# Patient Record
Sex: Female | Born: 1955 | Race: White | Hispanic: No | Marital: Single | Smoking: Never smoker
Health system: Southern US, Community
[De-identification: ages and names within clinical notes are randomized; demographics above are authoritative.]

## PROBLEM LIST (undated history)

## (undated) DIAGNOSIS — I878 Other specified disorders of veins: Secondary | ICD-10-CM

## (undated) DIAGNOSIS — E039 Hypothyroidism, unspecified: Secondary | ICD-10-CM

## (undated) DIAGNOSIS — J069 Acute upper respiratory infection, unspecified: Secondary | ICD-10-CM

## (undated) DIAGNOSIS — I10 Essential (primary) hypertension: Secondary | ICD-10-CM

## (undated) DIAGNOSIS — H539 Unspecified visual disturbance: Secondary | ICD-10-CM

## (undated) DIAGNOSIS — C55 Malignant neoplasm of uterus, part unspecified: Secondary | ICD-10-CM

## (undated) DIAGNOSIS — M26629 Arthralgia of temporomandibular joint, unspecified side: Secondary | ICD-10-CM

## (undated) DIAGNOSIS — M79673 Pain in unspecified foot: Secondary | ICD-10-CM

## (undated) DIAGNOSIS — K5790 Diverticulosis of intestine, part unspecified, without perforation or abscess without bleeding: Secondary | ICD-10-CM

## (undated) DIAGNOSIS — G4730 Sleep apnea, unspecified: Secondary | ICD-10-CM

## (undated) DIAGNOSIS — N390 Urinary tract infection, site not specified: Secondary | ICD-10-CM

## (undated) DIAGNOSIS — L255 Unspecified contact dermatitis due to plants, except food: Secondary | ICD-10-CM

## (undated) DIAGNOSIS — M797 Fibromyalgia: Secondary | ICD-10-CM

## (undated) DIAGNOSIS — R0989 Other specified symptoms and signs involving the circulatory and respiratory systems: Secondary | ICD-10-CM

## (undated) DIAGNOSIS — E785 Hyperlipidemia, unspecified: Secondary | ICD-10-CM

## (undated) HISTORY — DX: Arthralgia of temporomandibular joint, unspecified side: M26.629

## (undated) HISTORY — DX: Malignant neoplasm of uterus, part unspecified: C55

## (undated) HISTORY — PX: ABDOMINAL HYSTERECTOMY: SUR658

## (undated) HISTORY — DX: Unspecified contact dermatitis due to plants, except food: L25.5

## (undated) HISTORY — DX: Morbid (severe) obesity due to excess calories: E66.01

## (undated) HISTORY — DX: Pain in unspecified foot: M79.673

## (undated) HISTORY — DX: Other specified symptoms and signs involving the circulatory and respiratory systems: R09.89

## (undated) HISTORY — DX: Hyperlipidemia, unspecified: E78.5

## (undated) HISTORY — DX: Urinary tract infection, site not specified: N39.0

## (undated) HISTORY — DX: Other specified disorders of veins: I87.8

## (undated) HISTORY — DX: Fibromyalgia: M79.7

## (undated) HISTORY — PX: ENDOSCOPIC VEIN LASER TREATMENT: SHX1508

## (undated) HISTORY — DX: Unspecified visual disturbance: H53.9

## (undated) HISTORY — DX: Acute upper respiratory infection, unspecified: J06.9

## (undated) HISTORY — DX: Sleep apnea, unspecified: G47.30

## (undated) HISTORY — PX: DILATION AND CURETTAGE OF UTERUS: SHX78

## (undated) HISTORY — DX: Essential (primary) hypertension: I10

## (undated) HISTORY — DX: Diverticulosis of intestine, part unspecified, without perforation or abscess without bleeding: K57.90

## (undated) HISTORY — PX: FOOT SURGERY: SHX648

## (undated) HISTORY — DX: Hypothyroidism, unspecified: E03.9

---

## 1999-05-19 ENCOUNTER — Ambulatory Visit (HOSPITAL_COMMUNITY): Admission: RE | Admit: 1999-05-19 | Discharge: 1999-05-19 | Payer: Self-pay | Admitting: Obstetrics and Gynecology

## 1999-06-09 ENCOUNTER — Encounter (INDEPENDENT_AMBULATORY_CARE_PROVIDER_SITE_OTHER): Payer: Self-pay

## 1999-06-09 ENCOUNTER — Ambulatory Visit (HOSPITAL_COMMUNITY): Admission: RE | Admit: 1999-06-09 | Discharge: 1999-06-09 | Payer: Self-pay

## 2000-02-19 ENCOUNTER — Ambulatory Visit: Admission: RE | Admit: 2000-02-19 | Discharge: 2000-02-19 | Payer: Self-pay | Admitting: *Deleted

## 2002-05-14 ENCOUNTER — Ambulatory Visit (HOSPITAL_BASED_OUTPATIENT_CLINIC_OR_DEPARTMENT_OTHER): Admission: RE | Admit: 2002-05-14 | Discharge: 2002-05-14 | Payer: Self-pay | Admitting: Family Medicine

## 2002-06-30 ENCOUNTER — Ambulatory Visit (HOSPITAL_BASED_OUTPATIENT_CLINIC_OR_DEPARTMENT_OTHER): Admission: RE | Admit: 2002-06-30 | Discharge: 2002-06-30 | Payer: Self-pay | Admitting: Family Medicine

## 2002-11-25 ENCOUNTER — Inpatient Hospital Stay (HOSPITAL_COMMUNITY): Admission: AD | Admit: 2002-11-25 | Discharge: 2002-12-02 | Payer: Self-pay | Admitting: Internal Medicine

## 2002-11-25 ENCOUNTER — Encounter: Payer: Self-pay | Admitting: Family Medicine

## 2003-11-18 ENCOUNTER — Inpatient Hospital Stay (HOSPITAL_COMMUNITY): Admission: EM | Admit: 2003-11-18 | Discharge: 2003-11-25 | Payer: Self-pay | Admitting: *Deleted

## 2004-03-21 ENCOUNTER — Ambulatory Visit: Payer: Self-pay | Admitting: Internal Medicine

## 2004-05-16 ENCOUNTER — Ambulatory Visit: Payer: Self-pay | Admitting: Internal Medicine

## 2004-08-14 ENCOUNTER — Ambulatory Visit: Payer: Self-pay | Admitting: Internal Medicine

## 2004-08-21 ENCOUNTER — Ambulatory Visit (HOSPITAL_COMMUNITY): Admission: RE | Admit: 2004-08-21 | Discharge: 2004-08-21 | Payer: Self-pay | Admitting: Internal Medicine

## 2004-11-14 ENCOUNTER — Ambulatory Visit: Payer: Self-pay | Admitting: Internal Medicine

## 2005-05-15 ENCOUNTER — Ambulatory Visit: Payer: Self-pay | Admitting: Internal Medicine

## 2005-11-13 ENCOUNTER — Ambulatory Visit: Payer: Self-pay | Admitting: Internal Medicine

## 2006-03-12 HISTORY — PX: ABDOMINAL HYSTERECTOMY: SHX81

## 2006-05-15 ENCOUNTER — Ambulatory Visit: Payer: Self-pay | Admitting: Internal Medicine

## 2006-11-12 ENCOUNTER — Ambulatory Visit: Payer: Self-pay | Admitting: Internal Medicine

## 2006-11-15 ENCOUNTER — Ambulatory Visit: Payer: Self-pay | Admitting: Internal Medicine

## 2006-12-18 ENCOUNTER — Encounter (INDEPENDENT_AMBULATORY_CARE_PROVIDER_SITE_OTHER): Payer: Self-pay | Admitting: Obstetrics and Gynecology

## 2006-12-18 ENCOUNTER — Ambulatory Visit (HOSPITAL_COMMUNITY): Admission: RE | Admit: 2006-12-18 | Discharge: 2006-12-18 | Payer: Self-pay | Admitting: Obstetrics and Gynecology

## 2007-02-10 ENCOUNTER — Ambulatory Visit: Payer: Self-pay | Admitting: Internal Medicine

## 2007-08-08 DIAGNOSIS — J42 Unspecified chronic bronchitis: Secondary | ICD-10-CM

## 2007-08-08 DIAGNOSIS — IMO0001 Reserved for inherently not codable concepts without codable children: Secondary | ICD-10-CM

## 2007-08-08 DIAGNOSIS — G4710 Hypersomnia, unspecified: Secondary | ICD-10-CM

## 2007-08-08 DIAGNOSIS — J309 Allergic rhinitis, unspecified: Secondary | ICD-10-CM | POA: Insufficient documentation

## 2007-08-08 DIAGNOSIS — G4733 Obstructive sleep apnea (adult) (pediatric): Secondary | ICD-10-CM

## 2007-08-13 ENCOUNTER — Encounter: Payer: Self-pay | Admitting: Internal Medicine

## 2009-03-25 IMAGING — CT CT ABDOMEN W/O CM
4 of 9 series · 14 of 46 positions shown, 18 images · IV contrast (agent unspecified)
Comparison: none

CLINICAL DATA: Face and neck swelling.  Nausea.  Left submandibular pain.  The patient also has left abdominal wall swelling. 
 MAXILLOFACIAL CT WITHOUT CONTRAST:
TECHNIQUE: Axial and coronal CT imaging was performed through the maxillofacial structures.  No intravenous contrast was administered.
TECHNIQUE: Multidetector CT imaging of the neck was performed following the standard protocol without the administration of intravenous contrast.
TECHNIQUE: Multidetector CT imaging of the abdomen was performed following the standard protocol without IV contrast.

[Series 2: neck_routine 3.0 b40s st · axial · 0.46mm/px · z∈[-258,-216]mm · 2 of 84 slices shown]
[im 7/84  soft-tissue]
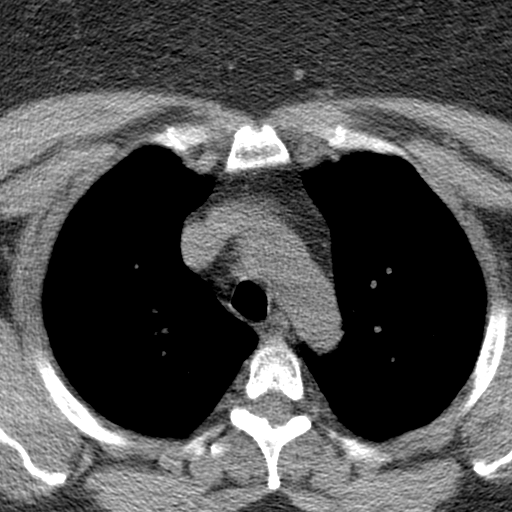
[im 21/84  soft-tissue]
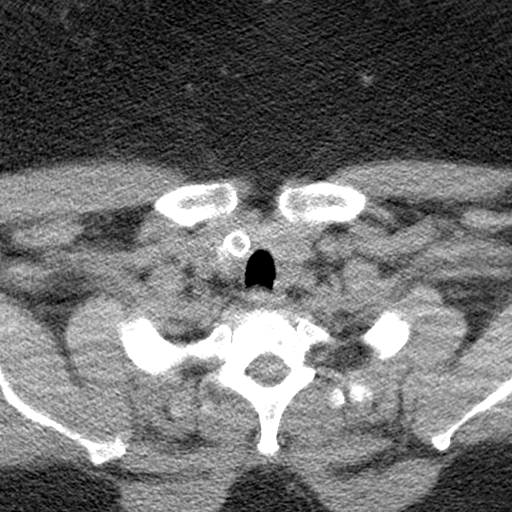

[Series 3: neck_routine 3.0 b70s lung · axial · 0.75mm/px · z∈[-253,-229]mm · 2 of 26 slices shown, 5 images]
[im 9/26  soft-tissue]
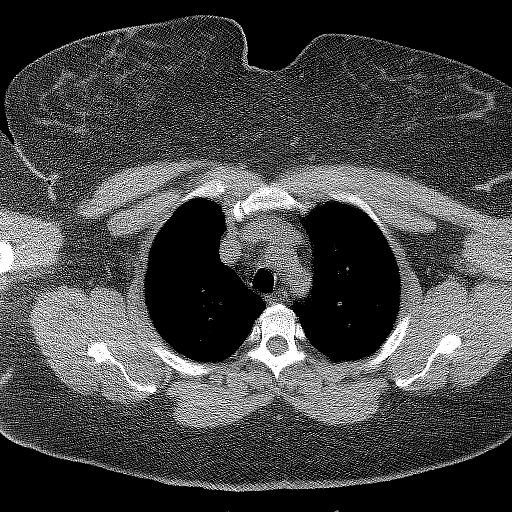
[im 9/26  lung]
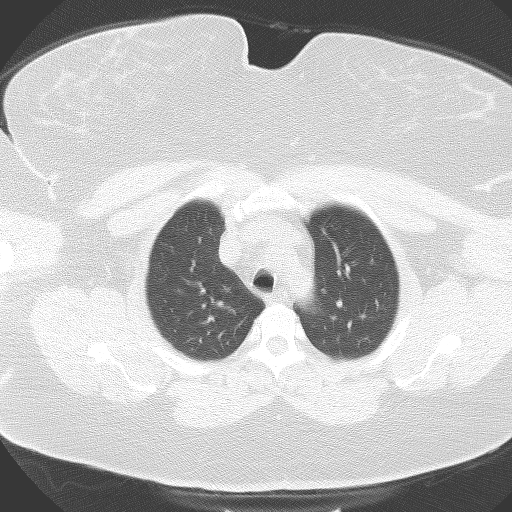
[im 9/26  bone]
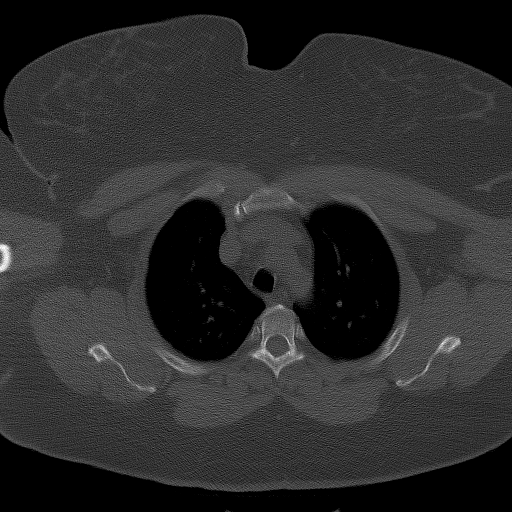
[im 17/26  soft-tissue]
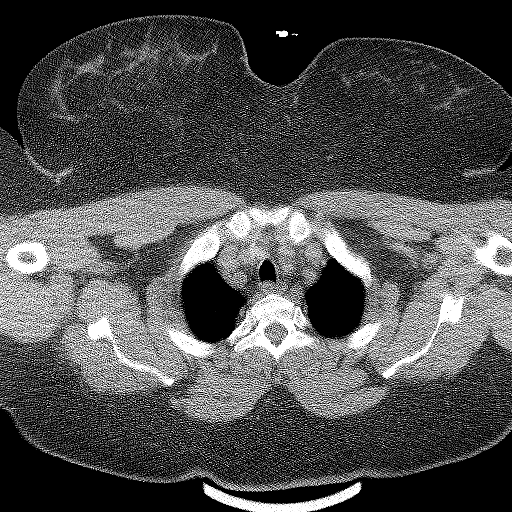
[im 17/26  lung]
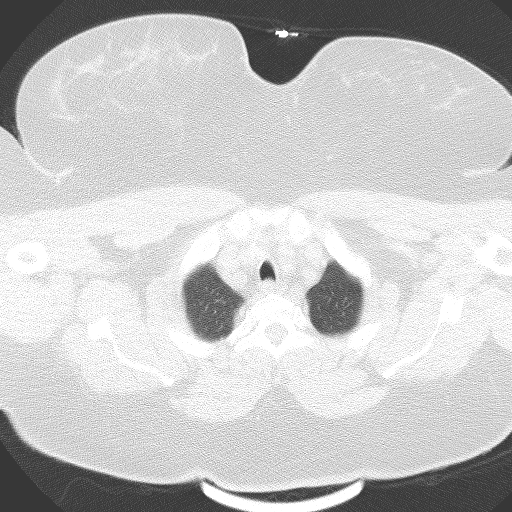

[Series 6: abd_xxl 5.0 b10f st · axial · 0.98mm/px · z∈[+984,+1214]mm · 7 of 62 slices shown]
[im 8/62  soft-tissue]
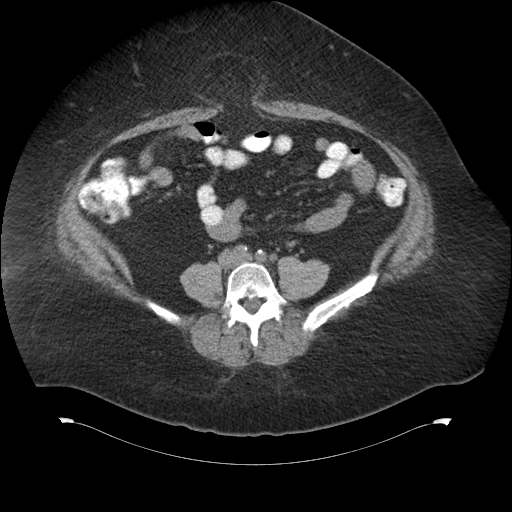
[im 16/62  soft-tissue]
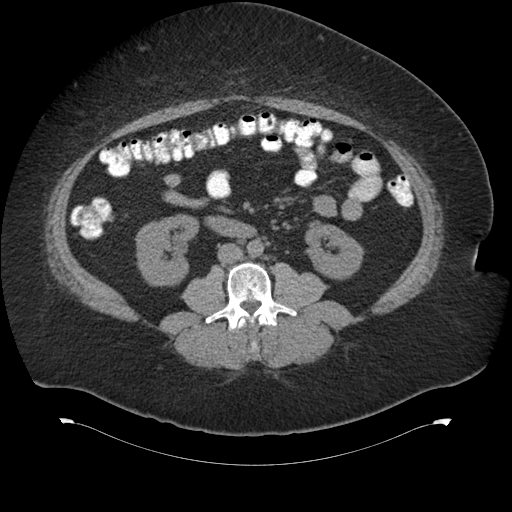
[im 23/62  soft-tissue]
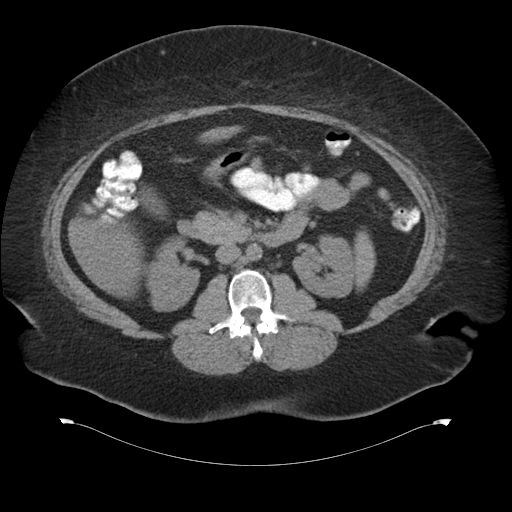
[im 31/62  soft-tissue]
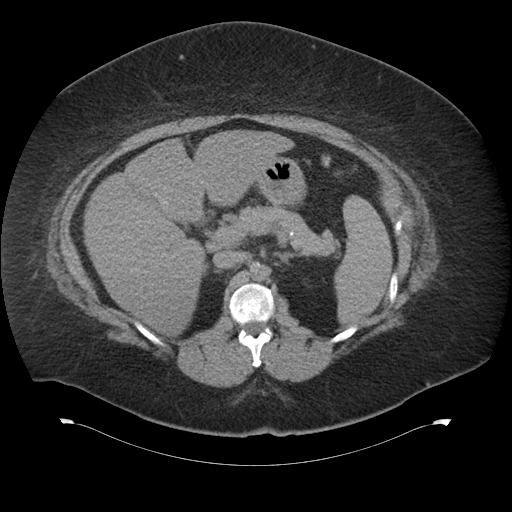
[im 39/62  soft-tissue]
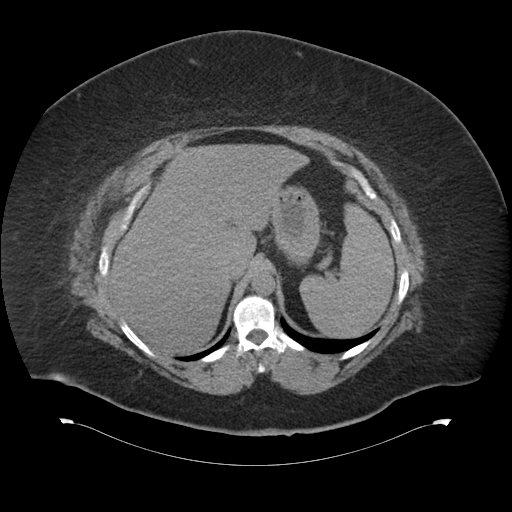
[im 46/62  soft-tissue]
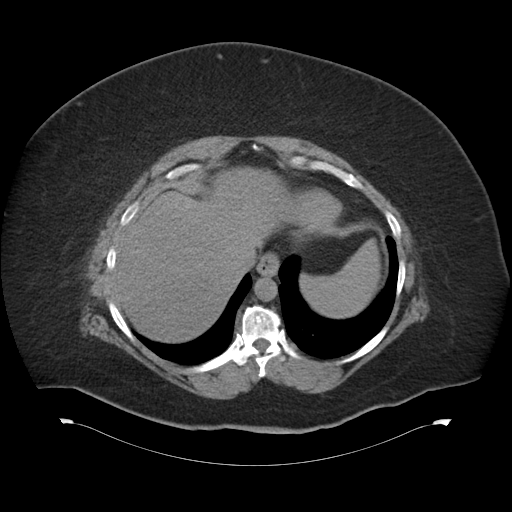
[im 54/62  soft-tissue]
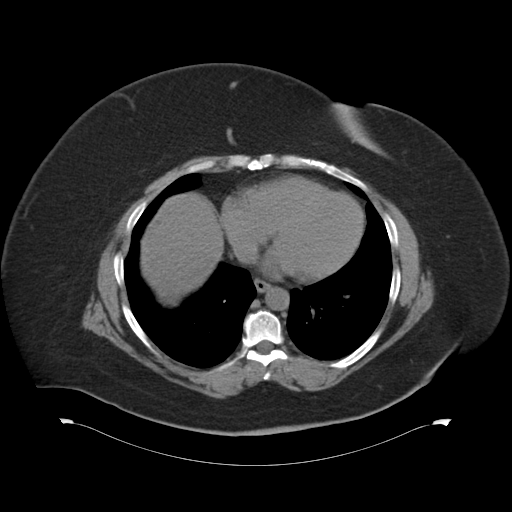

[Series 603: <mpr range> · coronal · 0.49mm/px · 3 of 108 slices shown, 4 images]
[im 27/108  soft-tissue]
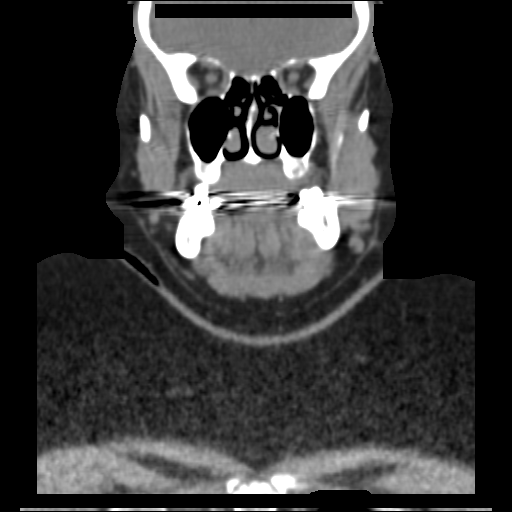
[im 54/108  soft-tissue]
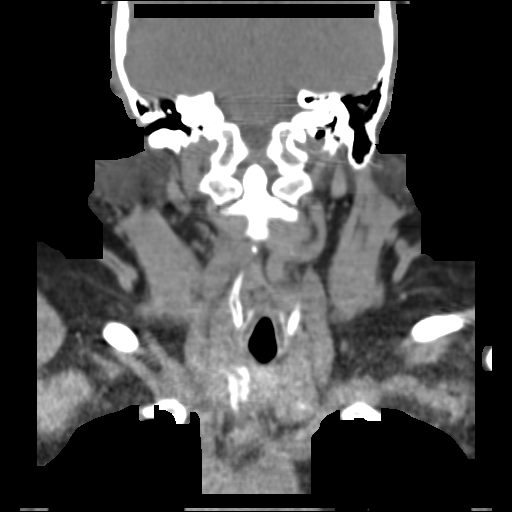
[im 54/108  bone]
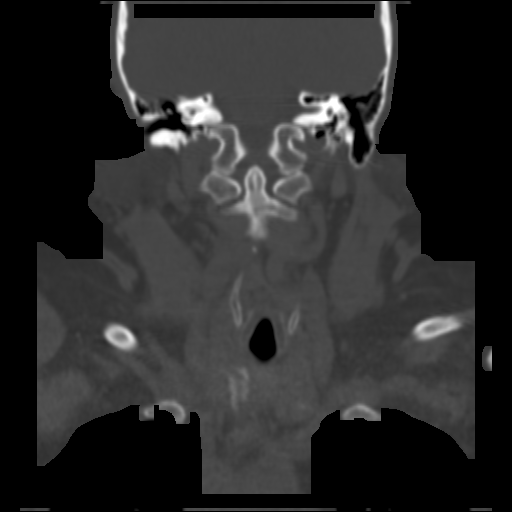
[im 81/108  soft-tissue]
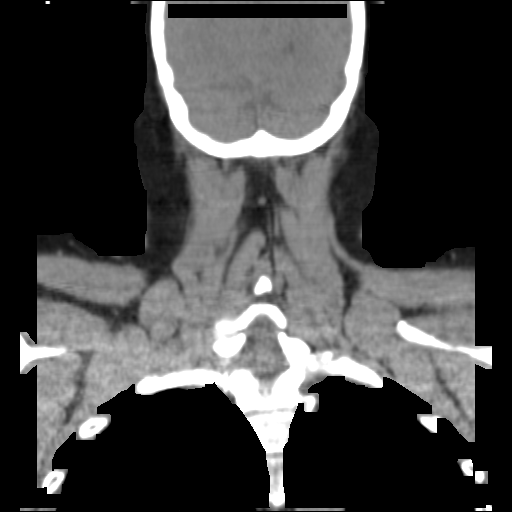

[14 of 46 positions shown; findings below may reference images not displayed]

FINDINGS: Visualized intracranial contents appear normal.  The parotid and submandibular glands appear normal.   There is a small bit of accessory parotid tissue on the left.  This is not pathologic.   No evidence of adenopathy or mass.  No significant osseous lesion.  Visualized sinuses are clear.
IMPRESSION: Negative CT scan of the face.
 NECK CT WITHOUT CONTRAST:
FINDINGS: Scans in the upper chest show no pulmonary lesion.  No enlarged lymph nodes are seen in the neck.  There is some calcification in the right side of the thyroid, likely related to calcification within adenomatous nodules.
IMPRESSION: 1. No acute or worrisome findings seen in the neck.  
 2. Calcification in the right side of the thyroid likely to relate to calcified adenomatous nodules.  
 ABDOMEN CT WITHOUT CONTRAST:
FINDINGS: Lung bases are clear.  The liver has an unremarkable appearance in the non-contrasted state.  No calcified gallstones.  The spleen is normal.  The pancreas is normal.  The adrenal glands are normal.  The kidneys are normal.  The aorta shows atherosclerotic change, but no aneurysm.  The IVC is normal.  No retroperitoneal mass or adenopathy.  No free intraperitoneal fluid or air.  No bowel pathology is seen.  The patient has a ventral hernia at the umbilicus, which contains only fat.  The scan does cover the upper part of the pelvis, and there appears to be a cystic abnormality of the right ovary.  This is not clearly evaluated.  This measures 7-8 cm in size and contains at least one septation.  Sonography would be recommended to evaluate this to investigate the potential of a cystic neoplasm.
IMPRESSION: 1. Ventral hernia at the umbilicus containing only fat.  
 2. 7-8 cm cystic abnormality of the right ovary with at least one septation.  Ultrasound is recommended to evaluate this further.  This could be a cystic neoplasm.

## 2009-06-03 ENCOUNTER — Encounter: Admission: RE | Admit: 2009-06-03 | Discharge: 2009-06-03 | Payer: Self-pay | Admitting: Family Medicine

## 2009-06-06 ENCOUNTER — Ambulatory Visit (HOSPITAL_COMMUNITY): Admission: RE | Admit: 2009-06-06 | Discharge: 2009-06-06 | Payer: Self-pay | Admitting: Family Medicine

## 2009-06-09 ENCOUNTER — Encounter: Admission: RE | Admit: 2009-06-09 | Discharge: 2009-06-09 | Payer: Self-pay | Admitting: Family Medicine

## 2009-06-17 ENCOUNTER — Encounter: Admission: RE | Admit: 2009-06-17 | Discharge: 2009-06-17 | Payer: Self-pay | Admitting: Family Medicine

## 2009-08-18 ENCOUNTER — Encounter (HOSPITAL_BASED_OUTPATIENT_CLINIC_OR_DEPARTMENT_OTHER): Admission: RE | Admit: 2009-08-18 | Discharge: 2009-10-05 | Payer: Self-pay | Admitting: Internal Medicine

## 2009-09-22 ENCOUNTER — Ambulatory Visit: Payer: Self-pay | Admitting: Vascular Surgery

## 2009-09-26 ENCOUNTER — Ambulatory Visit: Payer: Self-pay | Admitting: Vascular Surgery

## 2009-10-24 ENCOUNTER — Telehealth (INDEPENDENT_AMBULATORY_CARE_PROVIDER_SITE_OTHER): Payer: Self-pay | Admitting: *Deleted

## 2009-12-26 ENCOUNTER — Telehealth (INDEPENDENT_AMBULATORY_CARE_PROVIDER_SITE_OTHER): Payer: Self-pay | Admitting: *Deleted

## 2009-12-27 ENCOUNTER — Ambulatory Visit: Payer: Self-pay | Admitting: Vascular Surgery

## 2010-01-02 ENCOUNTER — Ambulatory Visit: Payer: Self-pay | Admitting: Vascular Surgery

## 2010-01-09 ENCOUNTER — Ambulatory Visit: Payer: Self-pay | Admitting: Vascular Surgery

## 2010-02-06 ENCOUNTER — Ambulatory Visit: Payer: Self-pay | Admitting: Vascular Surgery

## 2010-02-14 ENCOUNTER — Ambulatory Visit: Payer: Self-pay | Admitting: Vascular Surgery

## 2010-04-02 ENCOUNTER — Encounter: Payer: Self-pay | Admitting: Family Medicine

## 2010-04-13 NOTE — Progress Notes (Signed)
  Phone Note Other Incoming   Request: Send information Summary of Call: Request for records received from [employer]. Request forwarded to [facility].     

## 2010-04-13 NOTE — Progress Notes (Signed)
  Phone Note Other Incoming   Request: Send information Summary of Call: Request for records received from [employer]. Request forwarded to [facility].

## 2010-06-20 ENCOUNTER — Encounter: Payer: Self-pay | Admitting: Family Medicine

## 2010-06-27 ENCOUNTER — Encounter: Payer: Self-pay | Admitting: Family Medicine

## 2010-06-27 ENCOUNTER — Encounter: Payer: Self-pay | Admitting: Cardiology

## 2010-06-27 DIAGNOSIS — R0989 Other specified symptoms and signs involving the circulatory and respiratory systems: Secondary | ICD-10-CM | POA: Insufficient documentation

## 2010-06-27 DIAGNOSIS — L255 Unspecified contact dermatitis due to plants, except food: Secondary | ICD-10-CM | POA: Insufficient documentation

## 2010-06-27 DIAGNOSIS — R3589 Other polyuria: Secondary | ICD-10-CM | POA: Insufficient documentation

## 2010-06-27 DIAGNOSIS — M79673 Pain in unspecified foot: Secondary | ICD-10-CM | POA: Insufficient documentation

## 2010-06-27 DIAGNOSIS — M797 Fibromyalgia: Secondary | ICD-10-CM | POA: Insufficient documentation

## 2010-06-27 DIAGNOSIS — H539 Unspecified visual disturbance: Secondary | ICD-10-CM | POA: Insufficient documentation

## 2010-06-27 DIAGNOSIS — R7309 Other abnormal glucose: Secondary | ICD-10-CM | POA: Insufficient documentation

## 2010-06-27 DIAGNOSIS — J019 Acute sinusitis, unspecified: Secondary | ICD-10-CM | POA: Insufficient documentation

## 2010-06-27 DIAGNOSIS — R0789 Other chest pain: Secondary | ICD-10-CM | POA: Insufficient documentation

## 2010-06-27 DIAGNOSIS — R109 Unspecified abdominal pain: Secondary | ICD-10-CM | POA: Insufficient documentation

## 2010-06-27 DIAGNOSIS — I878 Other specified disorders of veins: Secondary | ICD-10-CM | POA: Insufficient documentation

## 2010-06-27 DIAGNOSIS — M26629 Arthralgia of temporomandibular joint, unspecified side: Secondary | ICD-10-CM | POA: Insufficient documentation

## 2010-06-27 DIAGNOSIS — E039 Hypothyroidism, unspecified: Secondary | ICD-10-CM | POA: Insufficient documentation

## 2010-06-27 DIAGNOSIS — E785 Hyperlipidemia, unspecified: Secondary | ICD-10-CM | POA: Insufficient documentation

## 2010-06-27 DIAGNOSIS — J069 Acute upper respiratory infection, unspecified: Secondary | ICD-10-CM | POA: Insufficient documentation

## 2010-06-27 DIAGNOSIS — I10 Essential (primary) hypertension: Secondary | ICD-10-CM | POA: Insufficient documentation

## 2010-06-27 DIAGNOSIS — G4730 Sleep apnea, unspecified: Secondary | ICD-10-CM | POA: Insufficient documentation

## 2010-06-27 DIAGNOSIS — C55 Malignant neoplasm of uterus, part unspecified: Secondary | ICD-10-CM | POA: Insufficient documentation

## 2010-06-27 DIAGNOSIS — N390 Urinary tract infection, site not specified: Secondary | ICD-10-CM | POA: Insufficient documentation

## 2010-06-27 DIAGNOSIS — K5790 Diverticulosis of intestine, part unspecified, without perforation or abscess without bleeding: Secondary | ICD-10-CM | POA: Insufficient documentation

## 2010-06-29 ENCOUNTER — Institutional Professional Consult (permissible substitution): Payer: Self-pay | Admitting: Cardiology

## 2010-07-17 ENCOUNTER — Ambulatory Visit (INDEPENDENT_AMBULATORY_CARE_PROVIDER_SITE_OTHER): Payer: Federal, State, Local not specified - PPO | Admitting: Cardiology

## 2010-07-17 ENCOUNTER — Encounter: Payer: Self-pay | Admitting: Cardiology

## 2010-07-17 DIAGNOSIS — R079 Chest pain, unspecified: Secondary | ICD-10-CM | POA: Insufficient documentation

## 2010-07-17 DIAGNOSIS — E785 Hyperlipidemia, unspecified: Secondary | ICD-10-CM

## 2010-07-17 DIAGNOSIS — I10 Essential (primary) hypertension: Secondary | ICD-10-CM

## 2010-07-17 DIAGNOSIS — M26629 Arthralgia of temporomandibular joint, unspecified side: Secondary | ICD-10-CM

## 2010-07-17 DIAGNOSIS — M2669 Other specified disorders of temporomandibular joint: Secondary | ICD-10-CM

## 2010-07-17 NOTE — Patient Instructions (Signed)
We will schedule you for a nuclear stress test.   I recommend you lose weight and focus on getting your diabetes under control.  Lexiscan Stress Test Your caregiver has ordered a Lexiscan Stress Test with nuclear imaging. The purpose of this test is to evaluate the blood supply to your heart muscle. This procedure is referred to as a "Non-Invasive Stress Test." This is because other than having an IV started in your vein, nothing is inserted or "invades" your body. Cardiac stress tests are done to find areas of poor blood flow to the heart by determining the extent of coronary artery disease (CAD). Some patients exercise on a treadmill, which naturally increases the blood flow to your heart. However, almost half of the patients undergoing a treadmill stress test each year are unable to exercise adequately. This is due to various medical conditions. For these patients, a pharmacologic/chemical stress agent called Eugenie Birks is used on patients without a history of asthma. This medicine will mimic walking on a treadmill by temporarily increasing your coronary blood flow. The side effects of this medicine include:  Headache.   Dizziness.   Shortness of breath.   Flushing.   Chest pain/pressure.   Feeling sick to your stomach (nauseous).   Increased heart rate.   Abdominal discomfort.   Low blood pressure.  BEFORE THE PROCEDURE  Avoid all forms of caffeine 24 hours before your test. This includes coffee, tea (even decaffeinated brands), caffeinated sodas, chocolate, cocoa and certain pain medications that contain caffeine.   Do not eat anything 6 hours before the test. In hospitalized patients, this means nothing by mouth after midnight.   Patients with diabetes that are not hospitalized (outpatients) should talk to their caregiver about how much, if any, insulin or oral diabetic medications they should take the day of the test. You should also talk about the type of light snack that you  should eat the morning of the test.   Patients with diabetes that are hospitalized (inpatients) will follow the cardiologist's written instructions that will be given to you.   Outpatients should bring a snack. Use the hospital vending machines so that you may eat right after the stress phase.   Wear comfortable shoes. There is at least an hour wait before the stress phase of nuclear scanning is done. The total procedure time is approximately 3 hours.   The following medications should be stopped 24 hours before your stress test: Beta-blockers and nitroglycerine (patches or paste should be removed 4 hours before the test).   Women, tell your caregiver if there is any possibility that you may be pregnant or if you are currently breastfeeding.  PROCEDURE There are two separate nuclear images taken using a nuclear camera. These images require a small dose of a radiotracer called an isotope. Most diagnostic nuclear medicine procedures result in low radiation exposure. Allergic reactions to the isotope may include slight redness going up the arm and pain during the injection. However, these reactions are extremely rare and usually mild. Eugenie Birks is given very rapidly over 10-15 seconds in the vein (intravenously) followed by a nuclear isotope. The medication causes the arteries in your heart to widen (dilate), and the nuclear isotope lights up those arteries so that the nuclear images are clear. Together, this shows whether the coronary blood flow is normal or abnormal. During this stress phase, you will be connected to an EKG machine. Your blood pressure and oxygen levels will be monitored by the cardiologist and cardiac nurse. This  part usually lasts 5-10 minutes. For inpatients, the procedure is done in the patient's room. For outpatients, the procedure is done in the stress lab.  The "Resting Phase" is done before the Lexiscan injection and shows how your heart functions at rest. The "Stress Phase" is  done about 1 hour after the Lexiscan injection and determines how your heart functions under stress.  LEXISCAN STRESS TEST IS USEFUL FOR DETERMINING:  The adequacy of blood flow to your heart during increased levels of activity in order to clear you for discharge home.   The extent of coronary artery blockage caused by CAD.   Your prognosis if you have suffered a heart attack.   The effectiveness of cardiac procedures done, such as an angioplasty, which can increase the circulation in your coronary arteries.   Cause(s) of chest pain/pressure.  RESULTS Not all test results are available during your visit. If your test results are not back during the visit, make an appointment with your caregiver to find out the results. Do not assume everything is normal if you have not heard from your caregiver or the medical facility. It is important for you to follow up on all of your test results. Document Released: 07/15/2008  [facility] Patient Information 2011 [place], [place].

## 2010-07-18 NOTE — Progress Notes (Signed)
[place] Date of Birth: 10/24/1955   History of Present Illness: [name] is a pleasant 55 year old white female who is seen at the request of Mady Gemma PA for evaluation of chest pain. She has multiple cardiac risk factors including diabetes, obesity, hypertension, hyperlipidemia, and obstructive sleep apnea. She reports that she has had a pain in her mid back between her shoulder blades that radiates into her chest. This pain has been present for years. She has had extensive evaluation of her back including plain x-rays, CT scan, an MRI. More recently she has also experienced a pain in her left side. She does complain of occasional shortness of breath. She has had some lightheadedness. She thinks that she has had some type of stress testing in the past but cannot recall when this was. She's been around a cardiac catheterization. She has no history of coronary disease.  Current Outpatient Prescriptions on File Prior to Visit  Medication Sig Dispense Refill  . albuterol (PROVENTIL HFA;VENTOLIN HFA) 108 (90 BASE) MCG/ACT inhaler Inhale 2 puffs into the lungs every 6 (six) hours as needed.        Marland Kitchen azelastine (ASTELIN) 137 MCG/SPRAY nasal spray 2 sprays by Nasal route 2 (two) times daily. Use in each nostril as directed       . candesartan-hydrochlorothiazide (ATACAND HCT) 32-12.5 MG per tablet Take 1 tablet by mouth daily.        . furosemide (LASIX) 20 MG tablet Take 20 mg by mouth daily.        Marland Kitchen gabapentin (NEURONTIN) 300 MG capsule Take 300 mg by mouth 2 (two) times daily.        Marland Kitchen HYDROcodone-acetaminophen (VICODIN) 5-500 MG per tablet Take 1 tablet by mouth every 6 (six) hours as needed.        Marland Kitchen levothyroxine (SYNTHROID, LEVOTHROID) 50 MCG tablet Take 50 mcg by mouth daily.        . metFORMIN (GLUCOPHAGE) 1000 MG tablet Take 1,000 mg by mouth 2 (two) times daily with a meal.        . metroNIDAZOLE (FLAGYL) 500 MG tablet Take 500 mg by mouth 3 (three) times daily.        .  potassium chloride (KLOR-CON) 10 MEQ CR tablet Take 10 mEq by mouth daily.        Marland Kitchen triamcinolone (KENALOG) 0.1 % cream Apply topically as needed.        . ciprofloxacin (CIPRO) 500 MG tablet Take 500 mg by mouth 2 (two) times daily.        . promethazine (PHENERGAN) 25 MG tablet Take 25 mg by mouth every 6 (six) hours as needed.        . TRIAMCINOLONE ACETONIDE IN Inhale 55 mcg into the lungs daily.          Allergies  Allergen Reactions  . Biaxin   . Chlorphen-Pseudoeph-Methscop   . Penicillins     REACTION: synthetic form    Past Medical History  Diagnosis Date  . Hypothyroidism   . Hypertension   . Diabetes mellitus   . Foot pain   . Change in vision   . Abdominal pain   . Abnormal glucose   . Acute sinusitis   . Sleep apnea   . Carotid bruit   . Dermatitis due to plants, including poison ivy, sumac, and oak   . Diverticulosis   . Fibromyalgia   . Hyperlipidemia   . Obesities, morbid   . TMJ pain dysfunction syndrome   .  URI (upper respiratory infection)   . Frequency of urination and polyuria   . UTI (urinary tract infection)   . Uterine cancer   . Venous stasis   . Morbid obesity   . Chest pain     Past Surgical History  Procedure Date  . Foot surgery   . Abdominal hysterectomy   . Endoscopic vein laser treatment     History  Smoking status  . Never Smoker   Smokeless tobacco  . Not on file    History  Alcohol Use No    Family History  Problem Relation Age of Onset  . Hypertension Mother   . Heart murmur Mother   . Arthritis Mother   . Hypertension Father   . Ulcers Father   . Heart attack Sister     Review of Systems: The review of systems is positive for occasional headaches and occasional dizziness.  Despite her extensive medical history and significant limitations patient feels that she is in good health. She has been treated recently for a bladder infection and diverticulitis.All other systems were reviewed and are  negative.  Physical Exam: BP 138/90  Pulse 82  Ht 5\' 3"  (1.6 m)  Wt 316 lb (143.337 kg)  BMI 55.98 kg/m2 She is a morbidly of these white female in no acute distress. She is normocephalic, atraumatic area pupils are equal round and reactive to light and accommodation. Extraocular movements are full. Sclera clear. Oropharynx is clear. Neck is supple without JVD, adenopathy, thyromegaly, or bruits. Lungs are clear. Cardiac exam reveals a regular rate and rhythm without gallop, murmur, or click. Abdomen is soft and nontender. There is no hepatosplenomegaly or masses. Femoral and pedal pulses are palpable. She has no edema. Skin is warm and dry. She does have some hyperpigmentation in her lower extremities. She is alert and oriented x3. Cranial nerves II through XII are intact. LABORATORY DATA: Dated June 27, 2010 complete chemistry panel was normal with the exception of glucose of 262 and a BUN of 28. Creatinine was 1.3. Total cholesterol 144, triglycerides 109, HDL 29, LDL 93. TSH was 4.39. A1c 10.7%. ECG demonstrated normal sinus rhythm with delayed R-wave progression. Otherwise normal.  Assessment / Plan:

## 2010-07-18 NOTE — Assessment & Plan Note (Signed)
Her symptoms are atypical and originate primarily from the back. However she is diabetic and a female and may have an atypical presentation of coronary disease. She does have multiple cardiac risk factors and I think it is warranted to do a nuclear stress test to further stratify her risk. We will schedule her for a Lexiscan Myoview study.

## 2010-07-18 NOTE — Assessment & Plan Note (Signed)
Based on her A1c her diabetic control is poor. I stressed the importance of being compliant with her carbohydrate modified diet and weight loss. She needs to take her medications as prescribed.

## 2010-07-18 NOTE — Assessment & Plan Note (Signed)
Her recent lipid parameters look fairly good. However her HDL is low. Her LDL is 93. Given her diabetes I think her target LDL should be 70. We may need to think about getting her on statin therapy.

## 2010-07-18 NOTE — Assessment & Plan Note (Signed)
Blood pressure appears to be under reasonable control. I have stressed the importance of sodium restriction. She needs to focus on weight loss long-term.

## 2010-07-24 ENCOUNTER — Ambulatory Visit (HOSPITAL_COMMUNITY): Payer: Medicare Other | Attending: Cardiology | Admitting: Radiology

## 2010-07-24 DIAGNOSIS — R0789 Other chest pain: Secondary | ICD-10-CM

## 2010-07-24 DIAGNOSIS — R0609 Other forms of dyspnea: Secondary | ICD-10-CM

## 2010-07-24 DIAGNOSIS — E119 Type 2 diabetes mellitus without complications: Secondary | ICD-10-CM

## 2010-07-24 DIAGNOSIS — R9431 Abnormal electrocardiogram [ECG] [EKG]: Secondary | ICD-10-CM

## 2010-07-24 DIAGNOSIS — R079 Chest pain, unspecified: Secondary | ICD-10-CM | POA: Insufficient documentation

## 2010-07-24 NOTE — Progress Notes (Signed)
[facility] SITE 3 NUCLEAR MED [address] [place] [address] [phone]  Cardiology Nuclear Med Study  [name] is a 55 y.o. female [id number] 26-Oct-1955   Nuclear Med Background Indication for Stress Test:  Evaluation for Ischemia History:  No previous documented CAD and Asthma Cardiac Risk Factors: Family History - CAD, Hypertension, Lipids and NIDDM  Symptoms:  Chest Pain, Chest Pressure with Exertion (last date of chest discomfort at present, chronic x years per patient), Dizziness, DOE, Light-Headedness, Nausea and Near Syncope   Nuclear Pre-Procedure Caffeine/Decaff Intake:  None NPO After: 8:00am   Lungs:  Clear IV 0.9% NS with Angio Cath:  22g  IV Site: R Forearm  IV Started by:  Stanton Kidney, EMT-P  Chest Size (in):  54 Cup Size: DD  Height: 5\' 3"  (1.6 m)  Weight:  316 lb (143.337 kg)  BMI:  Body mass index is 55.98 kg/(m^2). Tech Comments:  CBG = 243 @ 8 am, per patient    Nuclear Med Study 1 or 2 day study: 2 day  Stress Test Type:  Lexiscan  Reading MD: Arvilla Meres, MD  Order Authorizing Provider:  Peter Swaziland, MD  Resting Radionuclide: Technetium 63m Tetrofosmin  Resting Radionuclide Dose: 33 mCi   Stress Radionuclide:  Technetium 50m Tetrofosmin  Stress Radionuclide Dose: 33 mCi           Stress Protocol Rest HR: 70 Stress HR: 112  Rest BP: 129/74 Stress BP: 182/79  Exercise Time (min): n/a METS: n/a   Predicted Max HR: 165 bpm % Max HR: 67.88 bpm Rate Pressure Product: 52841   Dose of Adenosine (mg):  n/a Dose of Lexiscan: 0.4 mg  Dose of Atropine (mg): n/a Dose of Dobutamine: n/a mcg/kg/min (at max HR)  Stress Test Technologist: Irean Hong, RN  Nuclear Technologist:  Doyne Keel, CNMT     Rest Procedure:  Myocardial perfusion imaging was performed at rest 45 minutes following the intravenous administration of Technetium 74m Tetrofosmin. Rest ECG: NSR with poor wave progression  Stress Procedure:  The  patient received IV Lexiscan 0.4 mg over 15-seconds.  Technetium 65m Tetrofosmin injected at 30-seconds.  There were no significant changes with Lexiscan.  Quantitative spect images were obtained after a 45 minute delay. Stress ECG: No significant change from baseline ECG  QPS Raw Data Images:  Normal; no motion artifact; normal heart/lung ratio. Stress Images:  Normal homogeneous uptake in all areas of the myocardium. Rest Images:  Normal homogeneous uptake in all areas of the myocardium. Subtraction (SDS):  Normal Transient Ischemic Dilatation (Normal <1.22):  0.96 Lung/Heart Ratio (Normal <0.45):  0.32  Quantitative Gated Spect Images QGS EDV:  80 ml QGS ESV:  25 ml QGS cine images:  NL LV Function; NL Wall Motion QGS EF: 68%  Impression Exercise Capacity:  Lexiscan with no exercise. BP Response:  n/a Clinical Symptoms:  n/a ECG Impression:  No significant ECG changes with Lexiscan. Comparison with Prior Nuclear Study: No previous nuclear study performed  Overall Impression:  Normal stress nuclear study.       [name]

## 2010-07-25 NOTE — Assessment & Plan Note (Signed)
OFFICE VISIT   [name], Deborah L  DOB:  [birth year]                                       02/06/2010  ZDGUY#:[id number]   Patient had laser ablation of her right great saphenous vein for severe  reflux with valvular incompetence and venous hypertension with skin  changes in the right lower extremity.   She tolerated the procedure well.  Will return on December 6 for venous  duplex exam to confirm closure of the right great saphenous vein.     [name], M.D.  Electronically Signed   JDL/MEDQ  D:  02/06/2010  T:  02/06/2010  Job:  [id number]

## 2010-07-25 NOTE — Procedures (Signed)
LOWER EXTREMITY VENOUS REFLUX EXAM   INDICATION:  Questionable venous hypertension.   EXAM:  Using color-flow imaging and pulse Doppler spectral analysis, the  bilateral common femoral, superficial femoral, popliteal, posterior  tibial, greater and lesser saphenous veins are evaluated.  There is  evidence suggesting deep venous insufficiency in the bilateral lower  extremities.   The bilateral saphenofemoral junctions are not competent with Reflux of  >529milliseconds. The bilateral GSV's are not competent with Reflux of  >530milliseconds with the caliber as described below.   The bilateral proximal short saphenous vein demonstrates competency.   GSV Diameter (used if found to be incompetent only)                                            Right    Left  Proximal Greater Saphenous Vein           0.82 cm  0.97 cm  Proximal-to-mid-thigh                     0.88 cm  0.70 cm  Mid thigh                                 0.78 cm  1.02 cm  Mid-distal thigh                          0.99 cm  1.13 cm  Distal thigh                              1.07 cm  0.95 cm  Knee                                      0.80 cm  0.85 cm   IMPRESSION:  1. Bilateral greater saphenous vein Reflux with >569milliseconds is      identified with the caliber ranging from 0.78 cm to 1.07 cm knee to      groin in the right leg and 0.70 cm to 1.13 cm knee to groin in the      left leg.  2. The bilateral greater saphenous veins are not aneurysmal.  3. The bilateral greater saphenous veins are not tortuous.  4. The deep venous system is not competent  with > 500 milliseconds in      the bilateral legs.  5. The bilateral lesser saphenous veins are competent.        ___________________________________________  [name]. Hart [place], M.D.   CB/MEDQ  D:  09/22/2009  T:  09/22/2009  Job:  [id number]

## 2010-07-25 NOTE — Op Note (Signed)
NAMESHAKEVIA, [name]               ACCOUNT NO.:  [id number]   MEDICAL RECORD NO.:  [id number]          PATIENT TYPE:  AMB   LOCATION:  SDC                           FACILITY:  [facility]   PHYSICIAN:  Sherry A. Dickstein, M.D.DATE OF BIRTH:  August 31, 1955   DATE OF PROCEDURE:  12/18/2006  DATE OF DISCHARGE:                               OPERATIVE REPORT   PREOPERATIVE DIAGNOSIS:  Post menopausal bleeding.   POSTOPERATIVE DIAGNOSIS:  Post menopausal bleeding, endometrial polyp.   PROCEDURE:  Dilatation and curettage, hysteroscopy with resectoscope,  endometrial biopsy.   SURGEON:  Sherry A. Rosalio Macadamia, M.D.   ANESTHESIA:  Spinal.   INDICATIONS FOR PROCEDURE:  This is a 55 year old G0, P0 woman who had  previously not had any vaginal bleeding for several years.  She had had  intermittent spotting during the spring.  The patient had an ultrasound  last May which revealed thickened endometrium.  The patient returned for  evaluation in September with a small amount of recurrent vaginal  bleeding.  Because of the thickened endometrium, an endometrial biopsy  was attempted to be performed on the patient but because of the  patient's large body habitus, this was impossible, therefore, the  patient was brought to the operating room for [facility], hysteroscopy, with  resectoscope.   FINDINGS:  Endometrial polyp present and no significant other  endometrial pathology.   PROCEDURE:  The patient was brought into the operating room and given  adequate spinal anesthesia.  She was placed in the dorsal lithotomy  position.  Her perineum and vagina were washed with Betadine.  She was  draped in a sterile fashion.  A speculum was placed within the vagina.  The paracervical block was administered with 1% Nesacaine.  The anterior  lip of the cervix was grasped with a single tooth tenaculum.  The cervix  was founded, however, it was very difficult to sound the cervix, there  was some cervical stenosis.  Because  of this, it was felt that a D&C  might not be able to be performed, therefore, an endometrial biopsy was  obtained to make sure that an adequate endometrial sample was obtained.  Then, after the endometrial biopsy was performed, the true endometrial  cavity was identified and the cervix was able to be dilated.  It was  dilated with Shawnie Pons dilators initially to a #21.  Once the hysteroscope  was introduced, an endometrial polyp was seen.  It was coming into the  cervical canal.  This was partially resected with a right angle single  loupe resector, however, the remainder of the hysteroscope could not be  introduced in the endometrial cavity.  Therefore, the hysteroscope was  removed and the cervix was re-dilated to a #25.  The hysteroscope was  then replaced and was introduced into the endometrial cavity.  The  remainder of the cervical polyp was removed.  The endometrial cavity was  visualized.  There was no significant other abnormality seen, nothing  that looked like a specific cancer was seen.  A picture was obtained.  Using a single loupe right angle dissector, small sheets of  endometrial  tissue was obtained.  No further resections were obtained at this time.  All instruments were removed from the vagina.  It was felt that adequate  hemostasis was obtained.  The patient's bladder was in and out  catheterized.  It was felt that the spinal would last a little bit  longer and she needed this to be done at this time.  The patient was  then taken out of the dorsal lithotomy position.  She was moved from the  operating room to a stretcher in stable condition.  Complications were  none.  Estimated blood loss less than 5 mL.  Sorbitol differential minus  60 mL.   SPECIMENS:  1. Endometrial biopsy.  2. Endometrial resections.  3. Endometrial polyp.      Sherry A. Rosalio Macadamia, M.D.  Electronically Signed     SAD/MEDQ  D:  12/18/2006  T:  12/18/2006  Job:  [id number]

## 2010-07-25 NOTE — Assessment & Plan Note (Signed)
OFFICE VISIT   Rakestraw, Marcos L  DOB:  09-Nov-1955                                       12/27/2009  WRUEA#:[id number]   The patient returns today to continue followup for severe venous  insufficiency of the lower extremities with severe thickening of the  skin, chronic edema, burning, aching throbbing discomfort in both legs,  right worse than left.  She has been trying to wear elastic compression  stockings, although this is difficult because of the size of her thighs.  She is also taking ibuprofen and elevating the leg but this has not  relieved her symptomatology.  She has documented gross reflux in the  saphenofemoral junction and throughout the great saphenous veins to the  knee level bilaterally.  Her deep systems are competent.   PHYSICAL EXAMINATION:  Vital signs:  On exam today, her blood pressure  165/84, heart rate 99, respirations 14.  General:  She is an obese  female who is in no apparent distress, alert and oriented x3.  HEENT:  Exam normal for age.  EOMs intact.  Lower extremity:  Reveals 3+  dorsalis pedis pulse bilaterally.  She has thickening, hyperpigmentation  and chronic edema to the mid calf distally in both legs.   I think she would benefit from staged bilateral laser ablation of the  great saphenous veins beginning at the right side.  We will proceed with  precertification for this and perform it in the near future.     [name], M.D.  Electronically Signed   JDL/MEDQ  D:  12/27/2009  T:  12/28/2009  Job:  [id number]

## 2010-07-25 NOTE — Assessment & Plan Note (Signed)
OFFICE VISIT   [name], [name]  DOB:  08/14/1955                                       01/09/2010  WUXLK#:[id number]   The patient returns today 1 week post laser ablation of her left great  saphenous vein for severe venous hypertension secondary to valvular  incompetence in the great saphenous vein with severe edema and skin  changes in the left lower leg.  She has done very well since her  procedure with some mild to moderate discomfort in the proximal medial  thigh area of the great saphenous system.  She has had some difficulty  with compression because of the size of the lower extremities.  She does  take ibuprofen as instructed and states that the left calf and ankle  area feel much less tight than they did preoperatively.  She continues  to have pain and throbbing discomfort in the contralateral right leg.  She denies any chest pain, dyspnea on exertion, hemoptysis or other new  symptomatology.   PHYSICAL EXAM:  Vital signs:  Blood pressure is 130/78, heart rate 72,  respirations 22.  General:  She is an obese, well-developed female in no  apparent distress, alert and oriented x3.  Chest:  Clear to  auscultation.  Cardiovascular:  Regular rhythm.  No murmurs.  Abdomen:  Obese.  No masses.  Lower extremity exam reveals the left leg to have  severe hyperpigmentation in the lower third with a 2+ dorsalis pedis  pulse palpable and mild tenderness along the course of the proximal  great saphenous vein.   Today I ordered venous duplex exam which I reviewed and interpreted.  There is no evidence of DVT and her left great saphenous vein is totally  occluded from the mid thigh to the saphenofemoral junction.  I reassured  her regarding these findings.  She will return in the near future to  have a similar procedure done on the right side.     [name], M.D.  Electronically Signed   JDL/MEDQ  D:  01/09/2010  T:  01/09/2010  Job:  [id number]

## 2010-07-25 NOTE — Assessment & Plan Note (Signed)
OFFICE VISIT   [name], Deborah L  DOB:  1955/10/02                                       01/02/2010  BMWUX#:[id number]   The patient had laser ablation of the left great saphenous vein for  severe venous hypertension secondary to valvular incompetence in the  left great saphenous system with skin changes distally.  She tolerated  the procedure well.  Will return in 1 week for a followup duplex scan to  confirm closure.     [name], M.D.  Electronically Signed   JDL/MEDQ  D:  01/02/2010  T:  01/02/2010  Job:  [id number]

## 2010-07-25 NOTE — Assessment & Plan Note (Signed)
[facility]                             PULMONARY OFFICE NOTE   NAME:[name], [name]                      MRN:          [id number]  DATE:11/12/2006                            DOB:          1956/02/10    PROBLEM LIST:  1. Obstructive sleep apnea, with hypersomnia.  2. Morbid obesity.  3. Chronic bronchitis.  4. Allergic rhinitis.  5. Fibromyalgia.  6. Hyperesthesia left lateral abdomen.   HISTORY:  She complains today of some swelling left face and left neck,  noted by her mother.  The left side of her abdomen is always  hyperesthetic, and she feels a lump in her left lateral abdominal wall.  BiPAP pressure was felt too high at 10/5.  She denies ear or tooth  problems in the neck and has not recognized fever, mass, or purulent  discharge.   MEDICATIONS:  1. BiPAP I-10/E-5.  2. Neurontin 300 mg.  3. Synthroid 50 mcg.  4. Atacand 32/12.5.  5. Temazepam p.r.n.  6. Mucinex.  7. Astelin.  8. Celebrex 200 mg.  9. Furosemide 20 mg.  10.Potassium 20 mEq.  11.Skelaxin.  12.Glucosamine.  13.Albuterol inhaler.  14.Pepcid AC.  15.Over-the-counter pain medications.  16.Nasacort AQ.  17.Zyrtec.   DRUG INTOLERANCES:  1. SYNTHETIC PENICILLINS.  2. DURAHIST.   OBJECTIVE:  VITAL SIGNS:  Weight 341 pounds, BP 152/100, pulse 69, room  air saturation 98%.  GENERAL:  Very significantly overweight, with steady weight gain over  the past year.  HEENT:  Gag and 4/4 posterior palate.  No stridor or thyromegaly.  There  is mild puffiness of the left lower face, noted especially in the way  that it crowds the lower lid.  I do not feel any particular mass.  I do  not hear bruits at the neck or shoulder.   IMPRESSION:  1. Left lateral abdominal hernia?  2. Left face swelling and neck pain of uncertain etiology.   PLAN:  1. Advance is going to reduce the BiPAP pressure to inspiratory      9/expiratory 5.  2. Chest CT and x-rays left face and neck.  3.  Schedule return in 3 months, earlier p.r.n.     Clinton D. Maple Hudson, MD, Tonny Bollman, FACP  Electronically Signed    CDY/MedQ  DD: 11/12/2006  DT: 11/13/2006  Job #: [id number]   cc:   Evelena Peat, M.D.

## 2010-07-25 NOTE — Procedures (Signed)
DUPLEX DEEP VENOUS EXAM - LOWER EXTREMITY   INDICATION:  Follow up right greater saphenous vein laser ablation.   HISTORY:  Edema:  No.  Trauma/Surgery:  Right greater saphenous vein laser ablation,  02/06/2010; left greater saphenous vein laser ablation on 01/02/2010.  Pain:  Yes.  PE:  No.  Previous DVT:  No.  Anticoagulants:  No.  Other:   DUPLEX EXAM:                CFV   SFV   PopV  PTV    GSV                R  L  R  L  R  L  R   L  R  L  Thrombosis    o  o  o     o     o      +  Spontaneous   +  +  +     +     +      o  Phasic        +  +  +     +     +      o  Augmentation  +  +  +     +     +      o  Compressible  +  +  +     +     +      o  Competent     +  +  o     o     +      o   Legend:  + - yes  o - no  p - partial  D - decreased   IMPRESSION:  1. 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  2. Reflux of >500 milliseconds noted in the right superficial femoral      vein, popliteal vein, and saphenofemoral junction levels.  No      reflux noted in the lateral branch of the right greater saphenous      vein.  There is an incompetent perforator noted at the right distal      medial calf level measuring 0.53 cm.  3. No evidence of deep venous thrombosis noted in the right lower      extremity.    _____________________________  [name], M.D.   CH/MEDQ  D:  02/14/2010  T:  02/14/2010  Job:  [id number]

## 2010-07-25 NOTE — Consult Note (Signed)
NEW PATIENT CONSULTATION   [name], [name]  DOB:  [birth year]                                       09/26/2009  ZOXWR#:[id number]   The patient is a 55 year old morbidly obese female referred by Dr.  Valentina Lucks for severe venous insufficiency of both lower extremities.  She  is having aching and burning discomfort in both legs, right worse than  left, over the last several years which has been increasing in severity.  She has severe swelling in the legs as the day progresses and has  developed darkening of the scan over the last several months which has  become more thick and scaly in appearance.  She has had no history of  thrombophlebitis or deep venous thrombosis, bleeding, or ulceration.  She has worn elastic compression stockings (short-leg) in the past but  they became too tight and caused problems.  She elevates her leg which  helps her symptoms and takes ibuprofen on a daily basis.   CHRONIC MEDICAL PROBLEMS:  1. Hypertension.  2. Diabetes mellitus.  3. COPD.  4. Negative for coronary artery disease or stroke.   FAMILY HISTORY:  Positive for coronary artery disease and diabetes in  her mother, coronary artery disease in her father.  Negative for stroke.   SOCIAL HISTORY:  She is single and is unemployed.  Does not use tobacco  or alcohol.   REVIEW OF SYSTEMS:  Significant weight gain, occasional chest  discomfort, dyspnea on exertion.  No asthma or wheezing, or bronchitis.  Denies any urinary symptoms.  Has chronic leg discomfort bilaterally.  Multiple joint problems.  All other systems in review of systems are  negative.   PHYSICAL EXAM:  VITAL SIGNS:  Blood pressure 128/82, heart rate 70,  respirations 18.  GENERAL:  She is a morbidly obese female who is in no apparent distress,  alert and oriented x3.  HEENT:  Normal for age.  EOMs intact.  CHEST:  Clear to auscultation.  No rhonchi or wheezing.  CARDIOVASCULAR:  Regular rhythm.  No murmurs.   Carotid pulses 3+, no  bruits.  ABDOMEN:  Obese.  No masses.  MUSCULOSKELETAL:  No major deformities.  NEUROLOGIC:  Normal.  SKIN:  Severe hyperpigmentation of both legs, the lower third medially,  secondary to venous insufficiency.  She has 2+ edema in both legs.  Palpable posterior tibial pulses are 2+.  Thighs are quite large  bilaterally.  Some moderate varicosities in the medial calf on the right  and the lateral calf on the left.   Venous duplex exam was ordered by me and reviewed, and interpreted.  She  has severe reflux in both great saphenous veins up to and including  saphenofemoral junction; ABIs are triphasic bilaterally.   The patient does have severe venous insufficiency with severe skin  changes bilaterally and we will treat her initially with elastic  compression stockings (20 mm to 30 mm) as well as elevation and  ibuprofen.  She will return in 3 months with no improvement, which I  would seriously doubt, though she should have laser ablation of both  great saphenous veins in staged fashion.     [name], M.D.  Electronically Signed   JDL/MEDQ  D:  09/26/2009  T:  09/27/2009  Job:  [id number]   cc:   Jonelle Sports. Cheryll Cockayne, M.D.  Dr. Arlyce Dice

## 2010-07-25 NOTE — Assessment & Plan Note (Signed)
[facility]                             PULMONARY OFFICE NOTE   NAME:[name], [name]                      MRN:          [id number]  DATE:02/10/2007                            DOB:          [birth year]    PROBLEMS:  1. Obstructive sleep apnea with hypersomnia.  2. Morbid obesity.  3. Chronic bronchitis.  4. Allergic rhinitis.  5. Fibromyalgia.  6. Hyperesthesia, left lower abdomen.   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 She is using BiPAP regularly,  inspiratory 10, expiratory 5. She notices some dry nose now in the  winter weather with indoor heat. She has had flu vaccine. She also had  pneumococcal vaccine this year.   MEDICATIONS:  Medication list is reviewed without change. It is  significant today for an albuterol rescue inhaler used p.r.n. and for  her BiPAP.   OBJECTIVE:  Weight 347 pounds, blood pressure 144/88, pulse 77, room air  saturation 98%. Morbidly obese still. Alert. Actually seems fairly  comfortable. Lung fields are clear and unlabored. Heart sounds regular  without murmur. There is no neck vein distension or strider. Legs are  heavy, but without edema.   IMPRESSION:  No acute pulmonary process to interfere with surgery, but  her weight places her in a higher risk and I have told her to take her  BiPAP mask with her to [facility] for her surgery. We also discussed the  use of saline nasal gel if needed for dry nose and re-emphasized the  importance of weight loss. Schedule return 6 months, earlier p.r.n.     Clinton D. Maple Hudson, MD, Tonny Bollman, FACP  Electronically Signed    CDY/MedQ  DD: 02/16/2007  DT: 02/17/2007  Job #: [id number]   cc:   Evelena Peat, M.D.

## 2010-07-25 NOTE — Assessment & Plan Note (Signed)
OFFICE VISIT   Hey, [name]  DOB:  12/11/1955                                       02/14/2010  ZOXWR#:[id number]   Patient returns today 1 week post laser ablation of her right great  saphenous vein for severe venous hypertension secondary to valvular  incompetence of right great saphenous system with severe distal  hyperpigmentation and lipodermatosclerosis of both lower extremities.  She has had bilateral laser ablation now and states that the edema in  the lower third of her leg and ankles is dramatically improved since her  procedures.  She also states that the skin is less tense.  She has had  some mild-to-moderate discomfort along the site of the laser ablation in  her right thigh and groin area.  She denies any chest pain, dyspnea on  exertion, PND or orthopnea.   CHRONIC MEDICAL PROBLEMS:  1. Hypertension.  2. Diabetes mellitus.  3. COPD.   SOCIAL HISTORY:  She is single and unemployed.  Does not use tobacco or  alcohol.   PHYSICAL EXAMINATION:  There is much less tenseness below the knee in  both lower extremities, and the skin changes are quite stable with no  bulging varicosities or active ulceration.  There is mild tenderness  along the course of the great saphenous vein of the right leg.   Today I ordered a venous duplex exam, which I reviewed and interpreted.  The right great saphenous vein is totally occluded from the distal  insertion site near the saphenofemoral junction.  There is reflux in the  deep system.   I have reassured her regarding these findings, and she will continue to  elevate the leg as well as compression stockings and return to see [place] on  a p.r.n. basis.     [name] Rochester, M.D.  Electronically Signed   JDL/MEDQ  D:  02/14/2010  T:  02/15/2010  Job:  [id number]

## 2010-07-25 NOTE — Procedures (Signed)
DUPLEX DEEP VENOUS EXAM - LOWER EXTREMITY   INDICATION:  Followup from a left greater saphenous vein ablation 1 week  ago.   HISTORY:  Edema:  No.  Trauma/Surgery:  Left greater saphenous ablation 01/02/2010.  Pain:  Yes.  PE:  No.  Previous DVT:  No.  Anticoagulants:  No.  Other:   DUPLEX EXAM:                CFV   SFV   PopV  PTV    GSV                R  L  R  L  R  L  R   L  R  L  Thrombosis    o  o     o     o      o     +  Spontaneous   +  +     +     +      +     o  Phasic        +  +     +     +      +     o  Augmentation  +  +     +     +      +     o  Compressible  +  +     +     +      +     o  Competent     +  +     +     +      +     o   Legend:  + - yes  o - no  p - partial  D - decreased   IMPRESSION:  No evidence of acute deep venous thrombosis.  Successful  left greater saphenous ablation from junction to distal insertion site.    _____________________________  [name]. Hart Rochester, M.D.   OD/MEDQ  D:  01/10/2010  T:  01/10/2010  Job:  [id number]

## 2010-07-26 ENCOUNTER — Ambulatory Visit (HOSPITAL_COMMUNITY): Payer: Medicare Other | Attending: Cardiology | Admitting: Radiology

## 2010-07-26 DIAGNOSIS — R0989 Other specified symptoms and signs involving the circulatory and respiratory systems: Secondary | ICD-10-CM

## 2010-07-26 MED ORDER — TECHNETIUM TC 99M TETROFOSMIN IV KIT: 33.0000 | PACK | Freq: Once | INTRAVENOUS | Status: AC | PRN

## 2010-07-26 MED ORDER — REGADENOSON 0.4 MG/5ML IV SOLN
0.4000 mg | Freq: Once | INTRAVENOUS | Status: AC
  Administered 2010-07-24: 0.4 mg via INTRAVENOUS

## 2010-07-26 MED ORDER — TECHNETIUM TC 99M TETROFOSMIN IV KIT
33.0000 | PACK | Freq: Once | INTRAVENOUS | Status: AC | PRN
  Administered 2010-07-26: 33 via INTRAVENOUS

## 2010-07-27 NOTE — Progress Notes (Signed)
Nuclear stress test is normal. Please reassure the patient. [name]

## 2010-07-27 NOTE — Progress Notes (Signed)
Copy routed to Dr. Jordan.Falecha Clark ° °

## 2010-07-28 NOTE — Consult Note (Signed)
NAME:  [name], [name]                         ACCOUNT NO.:  [id number]   MEDICAL RECORD NO.:  [id number]                   PATIENT TYPE:  INP   LOCATION:  5729                                 FACILITY:  [facility]   PHYSICIAN:  Clinton D. Maple Hudson, M.D.              DATE OF BIRTH:  September 02, 1955   DATE OF CONSULTATION:  11/18/2003  DATE OF DISCHARGE:                                   CONSULTATION   PROBLEM FOR CONSULTATION:  A 55 year old woman seen at the request of Dr.  Lindie Spruce because of dyspnea and background medical problems during evaluation  for possible abdominal surgery.   HISTORY:  She is known to me since June of this year with problems of morbid  obesity, obesity hypoventilation, obstructive sleep apnea, and asthmatic  bronchitis.  She had had a diagnostic NPSG a year ago with an RDI of 21 per  hour, oxygen desaturation to 86%.  She did not tolerate CPAP initially.  She  was converted to BiPAP at an inspiratory pressure of 10 and expiratory  pressure of 5, but says she tried that for two nights and could not keep it  on because she was developing upper epigastric or subxiphoid pain attributed  to the pressure.  She has a history of asthma or asthmatic bronchitis being  treated with an albuterol inhaler and allergic rhinitis.  She has not  required home oxygen or home nebulizer.  She reports being more congested in  the past few days with trace yellow sputum.  Pulmonary function tests in my  office have been consistent with her morbidly obese body habitus, recording  forced vital capacity and an FEV1 both about 50% of predicted with little  response to bronchodilator.   REVIEW OF SYSTEMS:  Primary issue has been presentation with recent onset  epigastric pain.  She has felt warm, relieved by a fan, but has not had  chills.  She does not describe nausea or vomiting to suggest a risk for  reflux.  She does not describe significant bleeding, frankly purulent  discharge, pleuritic pain,  or palpitation.   PAST MEDICAL HISTORY:  1.  Asthma.  2.  Obstructive sleep apnea.  3.  Diverticulitis.  4.  Essential hypertension.  5.  Hypothyroidism.  6.  Morbid obesity.  7.  Fibromyalgia.  8.  Depression.   SOCIAL HISTORY:  Disabled, unmarried, living with mother.  Not smoking.   FAMILY HISTORY:  Father deceased, had history of hypertension and coronary  disease and renal insufficiency.  Mother has history of hypertension and  melanoma.   OBJECTIVE:  VITAL SIGNS:  Blood pressure 166/118, heart rate 96, respiration  initially 44, temperature 98.8, saturation 97% on 2 L.  GENERAL:  At current examination she is alert.  Appears somewhat  uncomfortable with shallow tachypnea but speech is clear.  She can talk in  full sentences.  SKIN:  No rash.  ADENOPATHY:  None  found at the neck, shoulders, or axillae.  HEENT:  Oral mucosa clear.  NECK:  No strider or neck vein distention.  CHEST:  Shallow breath sounds without cough or wheeze.  I hear no pleural  rub.  Examined anteriorly.  I cannot get to her back.  HEART:  Regular rhythm without murmur or rub.  Note, EKG on chart describes  sinus tachycardia and right atrial enlargement with pulmonary disease  pattern, left anterior fascicular block, and left ventricular hypertrophy.  ABDOMEN:  Quiet, morbidly obese.  I did not try to palpate her abdomen.  EXTREMITIES:  Very heavy, but without cords or edema.  No cyanosis.  No  tremor.   LABORATORIES:  WBC 12,500, hemoglobin 14.9.  Chemistry panel significant for  potassium 3.4, glucose 153, normal renal function.  ABG on room air:  pH  7.45, pCO2 32, pO2 67, bicarbonate 22.   IMPRESSION:  1.  Mild exacerbation of asthmatic bronchitis now on bronchodilator      nebulizers, Cipro, and Flagyl for her abdominal problem, and nasal      oxygen.  Will want to titrate her oxygen and we may need to add an      inhaled steroid if there is enough evidence of bronchospasm.  I do not      see  a chest x-ray ordered through the initial order set and will arrange      for one.  2.  Moderate obstructive sleep apnea with an RDI of 24 per hour.  Her main      problem here has been poor positive airway pressure tolerance.  Her      current BiPAP of 10/5 has not been tolerated at home and I am going to      try ordering a reduction to inspiratory 9/expiratory 4 and observe.  We      can bleed in oxygen.  3.  Obesity/hypoventilation will be aggravated by supine posture and she may      do best with whole bed tilted rather than just head of bed elevated to      shift her abdominal weight off of her diaphragm.  4.  I think she will be able to tolerate necessary surgery from a pulmonary      standpoint but we all recognize her increased risk of postoperative      problems.  5.  The pulmonary disease pattern on EKG may justify an echocardiogram      looking for evidence of pulmonary hypertension but this can be      considered subsequently.  Thank you for the chance to see her.                                               Clinton D. Maple Hudson, M.D.    CDY/MEDQ  D:  11/18/2003  T:  11/19/2003  Job:  [id number]   cc:   Jimmye Norman III, M.D.  [address], [address]  [place]  [place] [address]  Fax: [phone]   Maxwell Caul, M.D.  [address]  [place]  [place] [address]  Fax: [phone]   Marjory Lies, M.D.  [address]  [place]  [place] [address]  Fax: [phone]

## 2010-07-28 NOTE — Assessment & Plan Note (Signed)
 HEALTHCARE                               PULMONARY OFFICE NOTE   NAME:[name], [name]                      MRN:          [id number]  DATE:11/13/2005                            DOB:          [birth year]    PROBLEM:  1. Obstructive sleep apnea.  2. Morbid obesity.  3. Rhinitis.  4. Excessive daytime somnolence.  5. Fibromyalgia.  6. Chest wall pain.   HISTORY:  Describes a pressure sensation in the right maxillary area and  right side of her nose, where she says she is more congested but not able to  blow anything out.  She continues with her bilevel PAP machine, with an  inspiratory pressure of 10, expiratory pressure of 5, and has been stable  with this.  It has a humidifier.  She tried Astelin, but it does not seem to  have made much impression on her.  She does not remember Nasacort AQ at all.  Sneezing more in the last week.   MEDICATIONS:  1. BiPAP 10/5.  2. Neurontin 300 mg.  3. Synthroid 50 mcg.  4. Atacand 32/12.5 mg.  5. Temazepam p.r.n.  6. Mucinex and Astelin p.r.n.  7. Celebrex 200 mg.  8. Furosemide 20 mg.  9. Potassium.  10.Albuterol inhaler.  11.Pepcid AC.  12.Over-the-counter pain meds.   ALLERGIES:  Intolerant to SYNTHETIC FORM OF PENICILLIN, DURAHIST.   OBJECTIVE:  VITAL SIGNS:  Weight 329 pounds, BP 128/86, pulse regular 65,  room air saturation 99%.  HEENT:  Nostril sensitive to the touch of my speculum, maybe mild congestion  bilaterally.  No unilateral findings, no periorbital edema or discharge,  pharynx clear, no adenopathy.  LUNGS:  Clear, breath sounds shallow, consistent with her body habitus.  HEART:  Sounds regular.   IMPRESSION:  1. Rhinitis.  2. Obstructive sleep apnea, is controlled on BiPAP.  I do not think we      need to change that.  3. Exogenous obesity, remains the primary problem.   PLAN:  1. She was counseled on weight loss.  2. Nebulizer treatment with Neo-Synephrine.  3. Entex PSE 1  b.i.d. p.r.n. as a decongestant.  4. Schedule return in 6 months, earlier p.r.n.  5. Continue BiPAP.                                   Clinton D. Maple Hudson, MD, [facility], FACP   CDY/MedQ  DD:  11/17/2005  DT:  11/18/2005  Job #:  [id number]   cc:   Evelena Peat, M.D.

## 2010-07-28 NOTE — Progress Notes (Signed)
Notified of stress test results; sent to M[facility] Gemma

## 2010-07-28 NOTE — Consult Note (Signed)
[name], [name]                         ACCOUNT NO.:  [id number]   MEDICAL RECORD NO.:  [id number]                   PATIENT TYPE:  INP   LOCATION:  5729                                 FACILITY:  [facility]   PHYSICIAN:  [name], M.D.               DATE OF BIRTH:  08-09-1955   DATE OF CONSULTATION:  11/18/2003  DATE OF DISCHARGE:                                   CONSULTATION   HISTORY OF PRESENT ILLNESS:  This is a 55 year old woman with CT diagnosis  of sigmoid diverticulitis with localized perforation and peritonitis.   The patient was admitted approximately one year ago with abdominal pain  associated with a small bowel obstruction, and at that time was felt to be  acute diverticulitis also.  She did not have any evidence of perforation at  that time and was treated over a 7 day period of time with IV antibiotics  and subsequently sent home on oral antibiotics.  She has had intermittent  episodes of abdominal pain since that time, but this most severe attack  started about two days ago, was associated with nausea, vomiting and poor  appetite.  The patient has not eaten a meal in two days.  She comes in now  with a CT diagnosis of diverticulitis with a localized peritonitis and  perforation and surgical consultation was obtained.   PAST MEDICAL HISTORY:  1.  Morbid obesity.  2.  Asthma.  3.  Sleep apnea.  4.  Hypothyroidism.  5.  Arthritis.  6.  Fibromyalgia.   MEDICATIONS:  1.  One-A-Day vitamin.  2.  Neurontin.  3.  Celebrex.  4.  Synthroid.  5.  Atacand.  6.  Abdominal pain hydrocodone with acetaminophen.  7.  Zyrtec.  8.  Clarinex.  9.  Allegra.  10. Prevacid.  11. Albuterol inhaler.  12. Glucose.   ALLERGIES:  PENICILLIN INTOLERANCE.   PHYSICAL EXAMINATION:  GENERAL APPEARANCE:  She is morbidly obese, appears  to be in acute distress with shortness of breath and difficulty speaking  without taking multiple breaths.  VITAL SIGNS:  Afebrile with  temperature of 98.8, pulse 96, blood pressure  166/118.  Oxygen saturation level is 97% on 2 liters.  However, she is  breathing at a rate of 35-44 times a minute.  ABDOMEN:  Distended.  She has tenderness mostly in the left lower quadrant,  although, we can push on her abdomen and other parts of the abdomen.  She is  mostly tender on the left lower side.  She has very few bowel sounds.  RECTAL:  Not attempted.  She has multiple rolls of pannus on her anterior  abdominal wall.   LABORATORY DATA:  Her saturation is currently on 94-97% as checked.  She had  blood gases done which show most recently some mild hypoventilation with a  PCO2 of 44, PO 7.36 and PO2 57.  Her previous gas  showed a hyperventilation  with PCO2 in the low 30's and PO2 of 64.  CBC shows white count of 12.5,  hemoglobin 14.9.  Electrolytes within normal limits with slightly elevated  glucose of 153.   IMPRESSION:  Morbid obesity associated with acute diverticulitis with  localized peritonitis and perforation.  Although, in some patients with this  significant amount of pain and known perforation on CT, immediate surgery  would be warranted in this patient and the morbidity of this procedure  warrants at least attempting to treat with antibiotics to get it to subside  prior to trying to do exploration of sigmoid, colectomy and colostomy.  The  risks of this have been explained to the patient including the risks of  ostomy receding, wound infection and bleeding.  The patient understands  these risks and would like not to have surgery if possible.   Considering that this is the second or actually at least the second, maybe  more, episodes of abdominal pain associated with this process, some type of  colectomy may be warranted in the future as the patient gets through this  particular moment with IV antibiotics and not requiring an operation.  This  will possibly done with a colectomy and anastomosis, if the bowel prep  can  be done prior to the procedure.   Because of her pulmonary dysfunction, the patient has been seen by a  pulmonologist, Dr. Fannie [name], who has some notes on the chart from her  previous visits to him.  I have contacted him about seeing the patient this  evening, and hopefully getting her better breathing and perhaps decreasing  her respiratory rate.  There is the chance that the patient may still need  to go to surgery this evening or tomorrow morning if her abdominal pain  should worsen.   Thank you very much for asking me to see [name].                                               [name], M.D.    JW/MEDQ  D:  11/18/2003  T:  11/19/2003  Job:  [id number]   cc:   [name], M.D.  [address]  [place]  [place] [address]  Fax: [phone]   [name], M.D.  [address]  [place]  [place] [address]  Fax: [phone]

## 2010-07-28 NOTE — Op Note (Signed)
[facility]  Patient:    [name], [name]                      MRN: [id number] Proc. Date: 06/09/99 Adm. Date:  [id number] Attending:  Morene Antu CC:         Derry Skill, N.P., _____ [facility]                           Operative Report  PREOPERATIVE DIAGNOSES:       1. Menorrhagia.                               2. Thickened endometrium.  POSTOPERATIVE DIAGNOSES:      1. Menorrhagia.                               2. Thickened endometrium.  OPERATION:                    1. Dilatation and curettage.                               2. Hysteroscopy with resectoscope.  SURGEON:                      Sherry A. Rosalio Macadamia, M.D.  ANESTHESIA:                   General.  INDICATIONS:                  This is a 55 year old G0, P0, woman who has been having excessive heavy bleeding with her menstrual periods every month, lasting for three to seven days.  The patient has been passing large clots.  She had been treated with Provera cyclically without any relief of her bleeding.  Ultrasound had been performed showing a thickened endometrium with possible polyps.  Because of this, the patient was brought to the operating room for Navos, hysteroscopy.  FINDINGS:                     Thickened endometrium with polypoid tissue, question two polyps present.  DESCRIPTION OF PROCEDURE:     The patient was brought into the operating room and given adequate general anesthesia.  She was placed in dorsal lithotomy position, perineum was washed with Betadine.  Speculum was placed in the vagina.  The vagina was washed with Betadine.  The cervix was grasped with a single-tooth tenaculum. The cervix was sounded.  The cervix was dilated with Pratt dilators to a #31. Hysteroscope was easily introduced into the endometrial cavity.  There was blood clot present in the endometrial cavity.  Blood clot was removed through the scope. Once adequate  visualization could be obtained, the endometrium was sampled in sheets using the resectoscope with a double-loop right angle resector.  This was done circumferentially.  Pictures were obtained.  Adequate hemostasis was present, all instruments removed from the vagina.  The speculum was replaced in the vagina to assure adequate hemostasis, which was present.  This was removed.  The patient was taken out of the dorsal lithotomy position.  She was awakened.  She was extubated.  She was moved from the operating table to the stretcher in  stable condition.  Complications:  None.  Estimated blood loss less than 5 cc. Sorbitol differential -140.  DESCRIPTION OF PROCEDURE: DD:  06/09/99 TD:  06/09/99 Job: [id number]

## 2010-07-28 NOTE — Assessment & Plan Note (Signed)
[facility]                             PULMONARY OFFICE NOTE   NAME:Marrocco, KAYZLEE WIRTANEN                      MRN:          [id number]  DATE:05/15/2006                            DOB:          15-Oct-1955    PROBLEM:  1. Obstructive sleep apnea with hypersomnolence.  2. Morbid obesity.  3. Chronic bronchitis.  4. Allergic rhinitis.  5. Fibromyalgia.   HISTORY:  She continues using her BiPAP, inspiratory 10, expiratory 5.  Occasional right frontal/temporal headache in the distribution of her  previous shingles.  She is having increased sneezing, retro-orbital  pressure, consistent with allergic rhinitis, but not much chest  tightness, no wheeze.  She is using Nasacort.  When she runs out of it,  she will use Astelin for a few days, but does not like the taste.  We  discussed antihistamines.  She avoids decongestants because of her blood  pressure.   MEDICATION:  1. BiPAP, inspiratory 10, expiratory 5.  2. Neurontin 300 mg.  3. Synthroid 50.  4. Atacand 32/12.5.  5. Temazepam, Mucinex and Astelin are all used p.r.n.  6. Celebrex 200 mg.  7. Furosemide 20 mg.  8. Potassium 20 mEq.  9. Skelaxin 800 mg.  10.Glucosamine.  11.The p.r.n. use of an albuterol rescue inhaler.  12.Pepcid.  13.Nasacort AQ.  14.Astelin.   Drug intolerant to SYNTHETIC FORM OF PENICILLIN and DURAHIST.   OBJECTIVE:  She remains substantially overweight, up some more at 334  pounds.  BP 142/90, pulse rate was 64.  Room air saturation 99%.  During  our conversation, she began holding the upper right side of her head  briefly.  Speech was clear.  Conjunctivae not injected.  Nasal airway is  reddened, very sensitive to touch with the speculum, mildly congested.  Breathing is shallow, consistent with body habitus, but without wheeze  or rhonchi.  Heart sounds regular, without murmur.   IMPRESSION:  1. Seasonal allergic rhinitis.  2. Obstructive sleep apnea seems controlled  at current BiPAP settings,      but I am concerned about her weight-gain and this was discussed.  3. Headache, may reflect a post-herpetic neuralgia, but is in a muscle      distribution.   PLAN:  Nasal Neo-Synephrine inhalation __________.  Try over-the-counter  Claritin or Zyrtec.  Options were reviewed.  Schedule return in six  months, earlier p.r.n.     Clinton D. Maple Hudson, MD, [facility], FACP  Electronically Signed    CDY/MedQ  DD: 05/15/2006  DT: 05/15/2006  Job #: [id number]   cc:   Evelena Peat, M.D.

## 2010-07-28 NOTE — Discharge Summary (Signed)
[name], [name]                         ACCOUNT NO.:  [id number]   MEDICAL RECORD NO.:  [id number]                   PATIENT TYPE:  INP   LOCATION:  3875                                 FACILITY:  [facility]   PHYSICIAN:  Lonia Blood, M.D.            DATE OF BIRTH:  09-Feb-1956   DATE OF ADMISSION:  11/25/2002  DATE OF DISCHARGE:  12/02/2002                                 DISCHARGE SUMMARY   DISCHARGE DIAGNOSES:  1. Diverticulitis.  2. Questionable partial small-bowel obstruction - imaging limited secondary     to obesity.  3. Asthma with mild acute bronchitis - resolved.  4. Hypertension.  5. Morbid obesity.  6. Hypothyroidism.  7. Hypokalemia secondary to diarrhea and vomiting - resolved.  8. Fibromyalgia.  9. Status post hysteroscopy with dilatation and curettage 2001.   DISCHARGE MEDICATIONS:  1. Synthroid 50 mcg p.o. daily.  2. Atacand daily.  3. Albuterol inhaler two puffs p.r.n.  4. Avalox 400 mg daily for six days then stop.  5. Flagyl 500 mg t.i.d. for six days then stop.  6. Potassium chloride 20 mEq p.o. daily.  7. Vicodin one every four hours p.r.n.   FOLLOW UP:  The patient is instructed to follow up with her doctors at  [facility] in seven to 10 days.  At that time, a BMET  should be obtained to assure that the patient's potassium is well balanced.  Also, it should be assured at that time that the patient is tolerating a  regular diet and that she has normal formed stools.   PROCEDURE:  CT scan of the abdomen on November 27, 2002, - partial small-  bowel obstruction.  No free intraperitoneal air.  Diffuse fatty infiltrate  of the liver.  Significant diverticulitis.  It is conceivable that the  patient's small-bowel obstruction is secondary to her diverticulitis.   CONSULTATIONS:  None.   HISTORY OF PRESENT ILLNESS:  [name] is a pleasant, morbidly obese 55-  year-old female who presents to the hospital on the day of  admission with  complaints of diffuse abdominal pain.  This was localized to both epigastric  and the suprapubic area.  It was described as intermittent, stabbing and  crampy.  It radiated into her back.  She also endorsed diarrhea for multiple  episodes the date of admission.   HOSPITAL COURSE:  #1 - SMALL-BOWEL OBSTRUCTION/SIGMOID DIVERTICULITIS:  At  the time of admission, the patient was complaining of migratory abdominal  pains with decreased p.o. intake secondary to nausea and vomiting.  The  patient was also endorsing diarrhea.  KUB was obtained but because of the  patient's size, was not felt to be entirely reliable.  As a result, CT scan  of the abdomen was obtained.  This revealed a partial small-bowel  obstruction and significant sigmoid diverticulitis.  It was felt that the  diverticulitis was likely the etiology of the small-bowel  obstruction.  The  patient was treated symptomatically for this.  She was also placed on Flagyl  in addition to Avalox which had been initiated at the time of admission.  She tolerated this intervention without difficulty.  Diet was slowly  advanced. a T the time of discharge, the patient was afebrile.  Vital signs  were stable.  She was tolerating a regular diet.  Antibiotics were tolerated  well.  White count was elevated at the time of discharge at 20,000.  This is  concerning but clinically the patient appears extremely stable.  There is no  sign of decompensation at this time.  In fact, she looks better at this  point than she had during the entire hospitalization.  As a result, I find  it hard to keep her in the hospital based solely upon one laboratory test  when clinically the patient is definitely improved.  I have advised her that  should she develop severe abdominal pain again, or should she develop  intractable vomiting that she should return immediately to the emergency  room for evaluation.   PROBLEM #2 -  ASTHMA WITH MILD ACUTE  BRONCHITIS:  At the time of initial  hospitalization, the patient was exhibiting a significant amount of sputum  production and mild expiratory wheeze.  She was treated with albuterol and  Atrovent inhalers.  She was placed on Humibid as well.  Symptomatically, the  patient improved greatly.  At the time of discharge, she is without wheezing  and is at her baseline respiratory status.   PROBLEM #3 -  MORBID OBESITY:  Morbid obesity is a significant problem for  [name].  She was counseled during this hospitalization as to the  deleterious health effects of prolonged obesity.   PROBLEM #4 -  HYPOTHYROIDISM:  The patient was continued on Synthroid during  this hospitalization.  She tolerated this without difficulty.                                                Lonia Blood, M.D.    JTM/MEDQ  D:  12/02/2002  T:  12/02/2002  Job:  [id number]   cc:   [facility]

## 2010-07-28 NOTE — H&P (Signed)
NAME:  Deborah, [name]                         ACCOUNT NO.:  [id number]   MEDICAL RECORD NO.:  [id number]                   PATIENT TYPE:  INP   LOCATION:  1844                                 FACILITY:  [facility]   PHYSICIAN:  Raynelle Fanning A. Mayford Knife, M.D.             DATE OF BIRTH:  15-May-1955   DATE OF ADMISSION:  11/18/2003  DATE OF DISCHARGE:                                HISTORY & PHYSICAL   CHIEF COMPLAINT:  Abdominal pain, diarrhea.   HISTORY OF PRESENT ILLNESS:  [name] is a morbidly obese 55 year old  woman with a history of sigmoid diverticulitis, a partial small bowel  obstruction, for which she was admitted in September 2004, who presents via  EMS to the emergency department complaining of severe abdominal pain  accompanied by nausea without vomiting, and with diarrhea, and sense of  feverishness.   She describes that she has intermittent abdominal pain chronically, but then  in the last day, and especially last night, this has become exacerbated.  Initially most intense in the left lower quadrant, but spreading to the  right, and throughout her entire abdomen.  She has not noted blood in her  stool, and has not had an episode of vomiting.  Coincident with the  development of her abdominal pain has been worsening shortness of breath.  Her medical history documents a diagnosis of asthma, the details and  diagnostic criteria of which are unclear, and also a more fully clarified  diagnosis of sleep apnea, for which she has been followed by Dr. Fannie Knee, who has most recently increased her CPAP to BiPAP support q.h.s.  I  am currently in the process of receiving those documents from his office.   She is also complaining of a throbbing diffuse headache, unassociated with  change in mental status, vision, sensation, or strength.   PAST MEDICAL HISTORY:  1.  Sigmoid diverticulitis and partial small bowel obstruction, for which      she was hospitalized in September of 2004.   Diagnosis occurred via CT      scan.  2.  Hypothyroidism, replaced.  3.  Morbid obesity.  4.  Hypertension.  5.  Sleep apnea, for which she uses BiPAP q.h.s., setting 10/5.  6.  History of fibromyalgia.  7.  History of menorrhagia status post D&C in March of 2001 by Dr.      Rosalio Macadamia.  8.  History of PENICILLIN allergy.   MEDICATION LIST:  Pending.  She describes that a list was photocopied at the  office, but I have been unable to locate it.  I note that at discharge last  September she was taking Synthroid 50 mcg.   SOCIAL HISTORY:  The patient has lived with her parents.  She has never been  married, and has no children.  She does not abuse alcohol or tobacco.  She  is unemployed.   FAMILY HISTORY:  Notable for father  with hypertension, coronary disease, and  renal failure.  Mother with hypertension, obesity, history of melanoma, and  hypercholesterolemia.  One sister with obesity and hypercholesterolemia.  A  younger sister has a cardiac murmur.   PHYSICAL EXAMINATION:  VITAL SIGNS:  Temperature 100, heart rate 112, blood  pressure 179/80, respirations 30.  GENERAL:  She is a disheveled, short, extremely obese woman showing moderate  respiratory distress and discomfort, sitting at a 45 degree incline in the  emergency department.  HEENT:  Her face is not flushed or diaphoretic.  It is symmetric with dry  oral mucous membranes, symmetric gaze and pupils.  NECK:  Short without evidence of JVD or audible carotid bruit.  LUNGS:  No crackle, wheeze, or rhonchi, although auscultation is diminished  by her habitus.  She is not using accessory muscles, but is tachypneic at  30.  She becomes quite dyspneic with minimal speech.  HEART:  Fast and regular, with no significant murmur or rub appreciated.  ABDOMEN:  Quiet, tender in all quadrants, but most exquisitely in the mid-  abdomen.  There is no rigidity.  She does have voluntary guarding.  GU:  A Foley catheter is draining clear  yellow urine.  Her perineum is not  examined.  EXTREMITIES:  Lower extremities are noted for chronic non-pitting edema with  hyperpigmentation of chronic venostasis below the knee.  There is 1+  dorsalis pedis pulse on the right, 2+ on the left.  There is no cyanosis or  clubbing.   LABORATORY DATA:  White blood cell count 12.5, hemoglobin 14.9, platelets  226, with increased bands.  ABG on room air revealed a pH of 7.46, PCO2 of  32, PO2 of 67, bicarb of 23.  Chemistry is pending.  EKG revealed sinus  tachycardia, right atrial enlargement.  No evidence of acute ischemia.  All  imaging studies are pending.   ASSESSMENT AND PLAN:  1.  Abdominal pain with diarrhea.  This is suspicious for diverticulitis,      but differential is broad including pancreatitis, cholecystitis,      appendicitis, pyelonephritis, ovarian cyst, etc.  Will proceed with      abdominal CT with contrast, and treat empirically for diverticulitis      with Cipro and Flagyl.  Analgesia with IV morphine will be provided.      She would certainly not be a good surgical candidate, given her morbid      obesity and her respiratory compromise, and I hope this will be      something that will be easily reversed.  2.  Respiratory distress.  I am unclear what her underlying pulmonary      pathology is.  Her size certainly puts her a great risk of right-sided      heart failure and pickwickian physiology.  There is no evidence of      hypoventilation on her ABG.  She does carry a diagnosis of asthma, and I      will treat her with bronchodilators initially, but not with steroids      until I have more information.  Will attempt to fit her with a BiPAP      machine as ordered at home by Dr. Maple Hudson.  Should her respiratory status      decline further, I would recommend consulting him for further input, as      he knows what her prior history and diagnostic tests have been.  Raynelle Fanning  A. Mayford Knife, M.D.    JAW/MEDQ  D:  11/18/2003  T:  11/18/2003  Job:  [id number]   cc:   Joni Fears D. Young, M.D.  [address] [place]  [place] [address]  Fax: [phone]   [facility]

## 2010-07-28 NOTE — H&P (Signed)
[name], [name]                         ACCOUNT NO.:  [id number]   MEDICAL RECORD NO.:  [id number]                   PATIENT TYPE:  EMS   LOCATION:  ED                                   FACILITY:  [facility]   PHYSICIAN:  Lonia Blood, M.D.            DATE OF BIRTH:  11/07/1955   DATE OF ADMISSION:  11/25/2002  DATE OF DISCHARGE:                                HISTORY & PHYSICAL   CHIEF COMPLAINT:  Epigastric pain.   HISTORY OF PRESENT ILLNESS:  Ms. Coggin is a 55 year old, morbidly obese,  white female who presents with epigastric pain which has worsened over the  past five days.  It is accompanied by suprapubic pain.  The pain is  described as intermittently stabbing and intermittently cramping.  It  radiates to the back.  It is worse with inspiration.  She rates the pain  15/10 on a 0/10 pain scale.  She has had diarrhea, approximately eight  episodes this morning.  Positive for nausea, no vomiting.  Decreased p.o.  intake today.  Subjective fevers with chills and sweats.  Increased dyspnea  with the pain.  She is using her albuterol MDI q.2h.  She has no past  medical history of alcoholism or pancreatic disease.   REVIEW OF SYSTEMS:  Positive for neck pain, headache, productive cough, 6-10  pillow orthopnea, PND.  No rash, no vision changes.  She does have lower  extremity edema over the past year which is usually intermittent but is now  constant for a month.  Her menses occur every 3-6 months (she has had  longstanding metrorrhagia, her last menstrual period was three weeks ago).   PAST MEDICAL HISTORY:  1. Morbid obesity.  2. Hypertension.  3. Hypothyroidism.  4. Asthma.  5. Fibromyalgia.  6. History of acute bronchitis.   PAST SURGICAL HISTORY:  Hysteroscopy with a D&C in 2001 with polyps removed.   MEDICATIONS:  1. Albuterol MDI p.r.n.  2. Synthroid 50 mcg daily.  3. Neurontin 400 mg q.o.d. alternating with 400 mg b.i.d. every other day.  4. Celebrex  200 mg daily.  5. Atacand 32/12.5 mg daily.  6. Multivitamin one daily.   ALLERGIES:  PENICILLIN.   SOCIAL HISTORY:  She lives with her parents.  She is single and never  married.  She has no children.  No tobacco or alcohol or drugs.  She is  unemployed.   FAMILY HISTORY:  Her father is 91 years old.  He has hypertension, coronary  artery disease, and acute renal failure and is currently at [facility].  Her mother is 61 years old with hypertension, obesity, history of  melanoma, and hypercholesterolemia.  Her older sister has obesity and  hypercholesterolemia, and her younger sister has a cardiac murmur.   PHYSICAL EXAMINATION:  VITAL SIGNS:  Temperature 101.7, blood pressure  190/96, heart rate 100, respirations 18.  GENERAL:  She is alert,  tachypneic on exam, in mild distress secondary to  pain.  HEENT:  Pupils are equal, round, and reactive to light.  Extraocular muscles  are intact.  Oropharynx is clear with no erythema or exudate.  NECK:  No lymphadenopathy.  No thyromegaly.  No JVD.  LUNGS:  Difficult to assess secondary to body habitus, but they are clear to  auscultation bilaterally.  CARDIOVASCULAR:  Regular rate and rhythm with no murmurs, rubs, or gallops.  EXTREMITIES:  She has trace to 1 dorsalis pedis and posterior tibial pulses  bilaterally secondary to edema.  There is 2+ edema in the lower extremities  bilaterally.  ABDOMEN:  Positive bowel sounds in four quadrants.  Soft, obese, no  hepatosplenomegaly.  No rebound or guarding, but tenderness to palpation  over the epigastrium.  NEUROLOGIC:  Cranial nerves were grossly intact.  She moves all four  extremities.  There is no clonus.  She is alert and oriented x4.  BACK:  Unable to be assessed secondary to her inability to move.   LABORATORY DATA:  White blood cell count 13.7, hemoglobin 13, platelets 176,  92% segmented neutrophils, 3% lymphocytes.  Sodium 136, potassium 3.7,  chloride 102, bicarb 25,  BUN 12, creatinine 0.8, glucose 137, calcium 9.1.  Alkaline phosphatase 44, albumin 4.3.  AST 21, ALT 30.  Bilirubin 1.2,  lipase 12.  Urinalysis shows a specific gravity of 1.031, 40 of ketones, 30  of protein, 0-2 white blood cells and red blood cells, rare bacteria.  Urine  pregnancy is negative.   Chest x-ray:  No acute disease.  Abdominal films showed dilated loops of  bowel with a possible small bowel obstruction.  EKG shows normal sinus  rhythm with left axis deviation and poor R-wave progression.   ASSESSMENT/PLAN:  This is a 55 year old obese female with abdominal pain and  possible small bowel obstruction.   1. Abdominal pain.  I doubt this pain is secondary to a possible small bowel     obstruction, but all labs are currently within normal limits except for a     mildly elevated white blood cell count.  This pain may be secondary to     fibromyalgia flare.  Tonight we will allow bowel rest and reevaluate her     in the morning.  Consider an abdominal CT, but this will be difficult     secondary to her body habitus.  However, her white blood count is     elevated with prominent PMNs.  Consider an infectious source if her pain     persists.  This does not appear to be cardiac in nature.  Her EKG is     consistent with her body size.  For now will give IV pain medications     while she is n.p.o.  2. Hypertension.  Her blood pressure is elevated, but she has not taken her     medications today.  Will restart her normal home dose.  3.     Hypothyroidism.  Will check a TSH.  I doubt this is the etiology of her     pain, but this should be ruled out.  4. Asthma.  Will continue p.r.n. nebulizers.  5. The patient is okay to be transferred to [facility] since her     father is admitted there.         Maurice March, M.D.  Lonia Blood, M.D.    LC/MEDQ  D:  11/25/2002  T:  11/25/2002  Job:  [id number]  cc:   [facility]

## 2010-07-28 NOTE — Discharge Summary (Signed)
[name], [name]                         ACCOUNT NO.:  [id number]   MEDICAL RECORD NO.:  [id number]                   PATIENT TYPE:  INP   LOCATION:  5730                                 FACILITY:  [facility]   PHYSICIAN:  Lonia Blood, M.D.            DATE OF BIRTH:  11/20/1955   DATE OF ADMISSION:  11/18/2003  DATE OF DISCHARGE:  11/25/2003                                 DISCHARGE SUMMARY   DISCHARGE DIAGNOSES:  1.  Sigmoid diverticulitis with microperforation.      1.  Free air within the abdomen noted on computed tomography scan.      2.  Sigmoid diverticulitis appreciated by computed tomography scan.      3.  Surgical consult accomplished.      4.  Medical therapy indicated.      5.  Significant improvement with such.  2.  Known sigmoid diverticulitis and partial small bowel obstruction      requiring hospitalization in September 2004.  3.  Hypothyroidism.  4.  Morbid obesity.  5.  Hypertension.  6.  Sleep apnea with nightly biphasic positive airway pressure at 10/5.  7.  Fibromyalgia.  8.  Menorrhagia, status post dilatation and curettage in March 2001.  9.  Allergy to penicillin.   DISCHARGE MEDICATIONS:  1.  Flagyl 500 mg p.o. t.i.d. x10 days, then stop.  2.  Cipro 500 mg b.i.d. x10 days, then stop.  3.  Oxycodone 5 mg one to two every 6 hours p.r.n. pain.  4.  Resume all of her usual prehospital medications.   FOLLOW UP:  The patient will follow up with her primary care physician at  [facility] in 5-6 days.  At that time, the patient will  simply need to have a physical exam to ensure that there is no evidence of  peritonitis.  We will need to make sure that her pain is well-controlled,  that her bowels are moving and that she is tolerating a regular diet.   CONSULTATIONS:  1.  Jimmye Norman, M.D., general surgery.  2.  Clinton D. Maple Hudson, M.D., pulmonary.   PROCEDURE:  Computed tomography scan of the abdomen and pelvis on November 18, 2003,  with diffuse fatty infiltrate of the liver, free intraperitoneal  gas compatible with bowel rupture and sigmoid diverticulitis.   HISTORY OF PRESENT ILLNESS:  [name] is a pleasant, 55 year old  who presented to the hospital on November 18, 2003, with complaints of  severe abdominal pain and diarrhea.  She felt that she was experiencing  subjective fevers at home.  There was a severe nausea, but no vomiting.  The  patient presented to the emergency room for evaluation.  There, she was  diagnosed with free intraperitoneal air and was admitted to the acute unit  for further evaluation.   HOSPITAL COURSE:  [name] was admitted to the hospital with complaints  of severe abdominal pain,  diarrhea and subjective fever.  Full evaluation  led to a CT scan of the abdomen which ultimately revealed evidence of  intraperitoneal air.  There was no evidence of other organ dysfunction with  exception to fatty infiltrate of the liver.  There was evidence of sigmoid  diverticulitis and the patient is known to have diverticular disease.  Working diagnosis was perforation of a diverticular abscess.  The patient  was treated with intravenous antibiotics empirically.  Surgical consultation  was requested.  Dr. Lindie Spruce saw the patient and he did not feel that the  patient required aggressive intervention initially and agreed to monitoring  only.  Dr. Lindie Spruce in term consulted pulmonary to assure that the patient  would be stable should surgery be required.  Dr. Jetty Duhamel visited the  patient and was able to recommend adjustments to her medical regimen to  assure that she was maximized from the pulmonary standpoint.  Unfortunately,  the patient stabilized without the requirement for surgery.  Diet was slowly  advanced.  Antibiotics were transitioned over to p.o. form.  At the time of  discharge, the patient is tolerating a regular diet.  She is moving her  bowels.  There are no peritoneal signs.   She has been able to get up and get  around without difficulty.   The patient has severe morbid obesity with associated obesity  hypoventilation sleep apnea.  She has a history of severe bronchospasm  associated with this.  Fortunately, this was stable during her  hospitalization.  She uses CPAP at night and this was continued.  Ongoing  followup for pulmonary issues should be directed to Dr. Jetty Duhamel on a  p.r.n. basis.   Fortunately, [name] other medical issues were stable during this  hospitalization.  She had no acute complications of her remaining medical  problems were encountered.  Her outpatient medical regimen was continued  during hospitalization and she tolerated this well.   On November 25, 2003, the patient was afebrile.  Vital signs were stable.  Saturations were 93% on room air.  She was tolerating a regular diet,  ambulating in her room and moving her bowels spontaneously.  She was cleared  for discharge home.                                                Lonia Blood, M.D.    JTM/MEDQ  D:  11/25/2003  T:  11/25/2003  Job:  [id number]   cc:   Teena Irani. Arlyce Dice, M.D.  [address]  [place]  [place] [address]  Fax: [phone]   Clinton D. Young, M.D.  [address] [place]  [place] [address]  Fax: [phone]   Jimmye Norman III, M.D.  [address]  [place]  [place] [address]  Fax: [phone]

## 2010-12-14 ENCOUNTER — Other Ambulatory Visit: Payer: Self-pay | Admitting: Family Medicine

## 2010-12-21 LAB — BASIC METABOLIC PANEL
BUN: 15
Calcium: 8.6
Creatinine, Ser: 0.75
GFR calc Af Amer: >60
Glucose, Bld: 168 — ABNORMAL HIGH
Sodium: 134 — ABNORMAL LOW

## 2010-12-21 LAB — CBC
HCT: 38.8
MCV: 88.3
Platelets: 14 — CL
RBC: 4.39

## 2011-10-12 IMAGING — US US CAROTID DUPLEX BILAT
1 series · 13 of 24 positions shown · non-contrast
Comparison: None.

CLINICAL DATA: Asymptomatic right bruit.

BILATERAL CAROTID DUPLEX ULTRASOUND
TECHNIQUE: Gray scale imaging, color Doppler and duplex ultrasound
was performed of bilateral carotid and vertebral arteries in the
neck.

[Series 1: us carotid duplex bilat · 0.08mm/px · 13 of 68 slices shown]
[im 1/68]
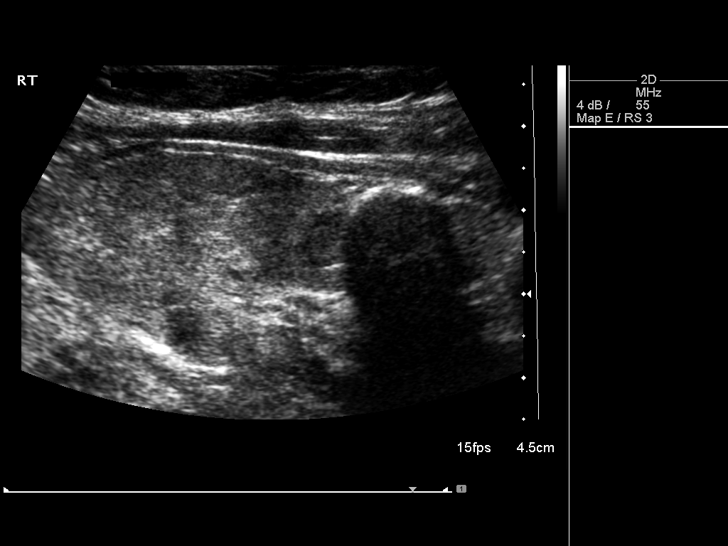
[im 6/68]
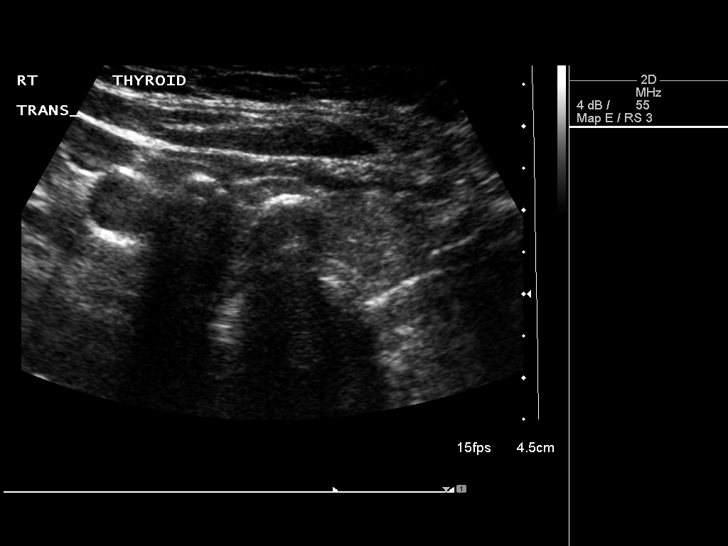
[im 12/68]
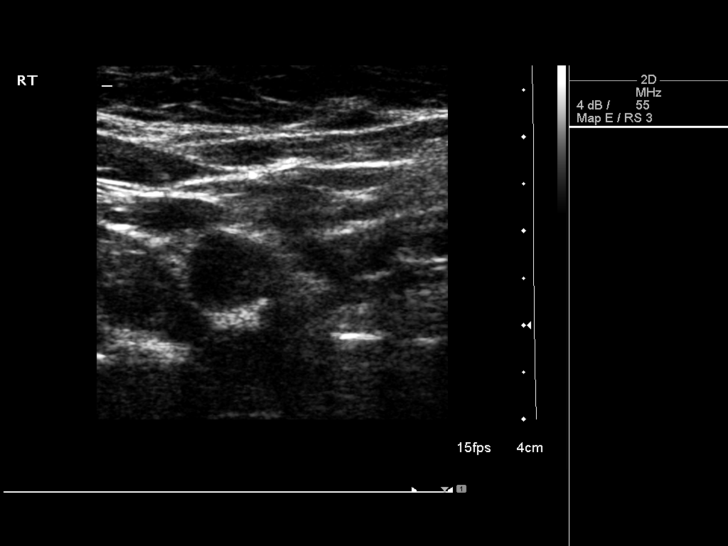
[im 18/68]
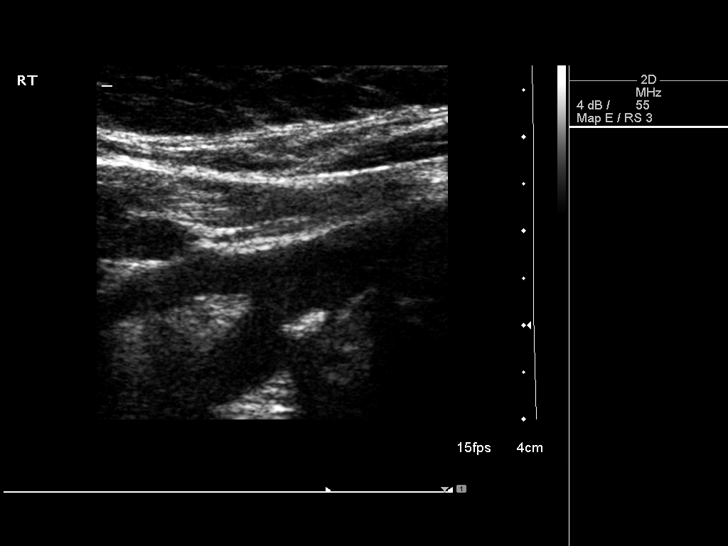
[im 24/68]
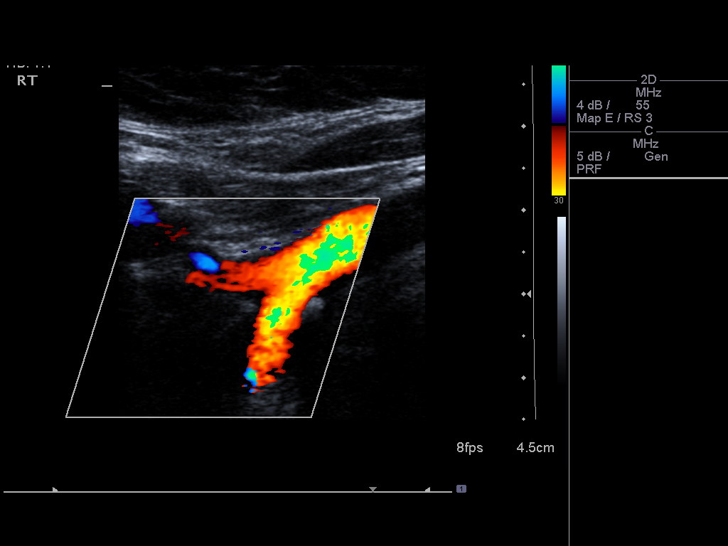
[im 30/68]
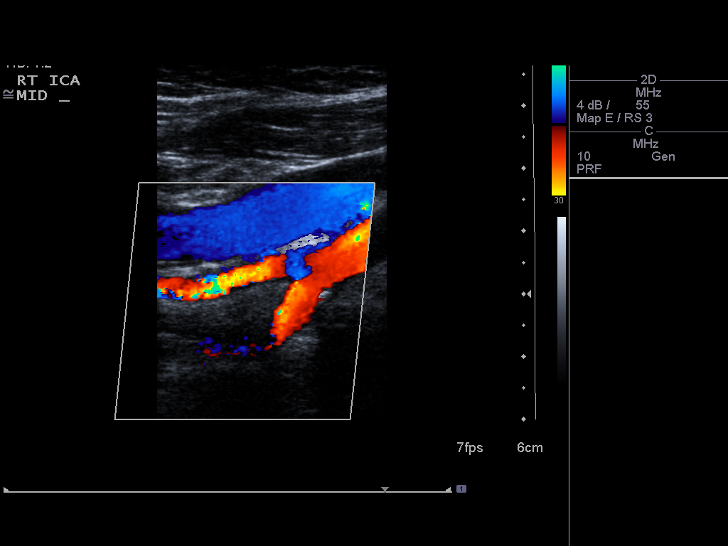
[im 35/68]
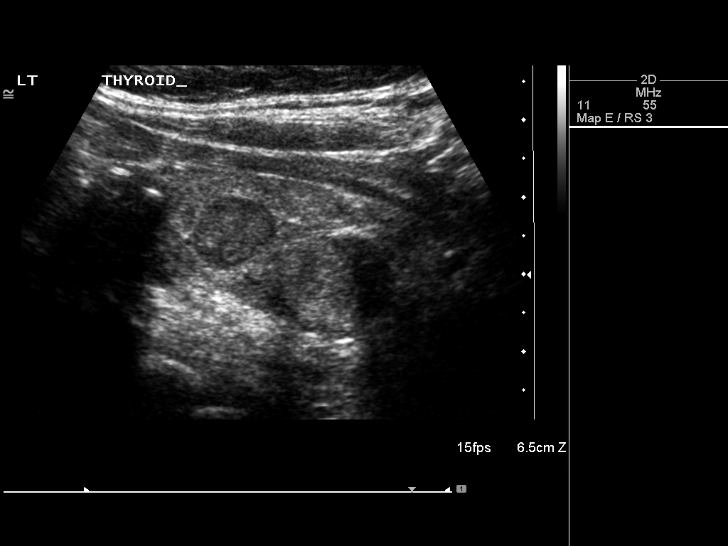
[im 38/68]
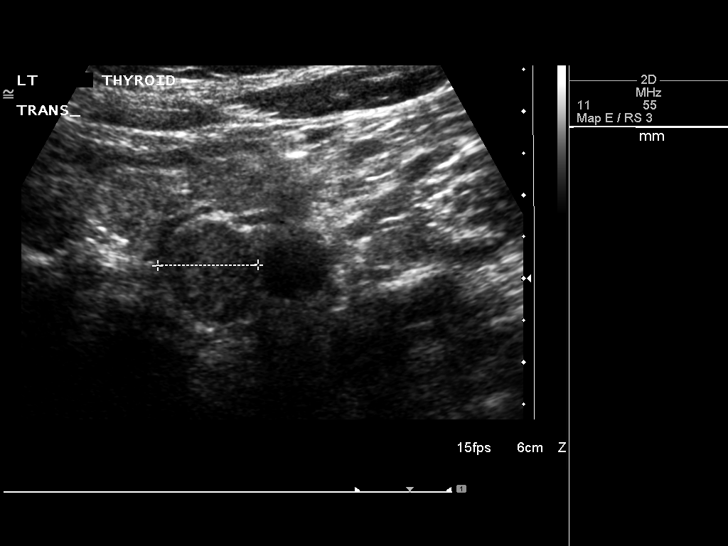
[im 44/68]
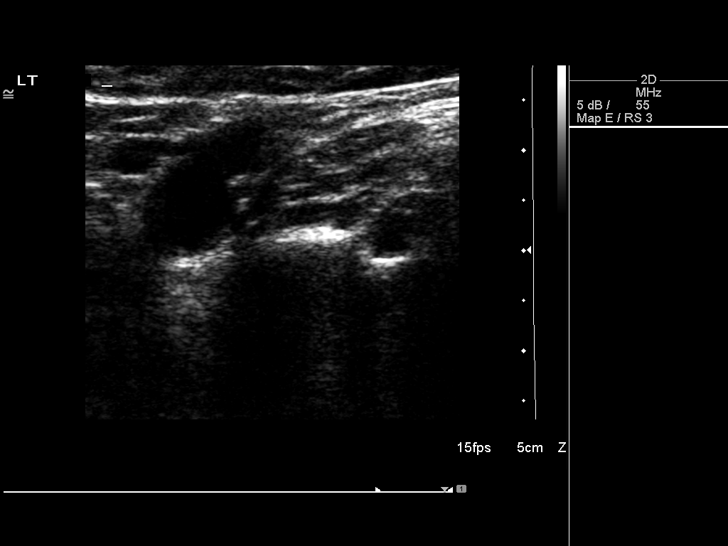
[im 50/68]
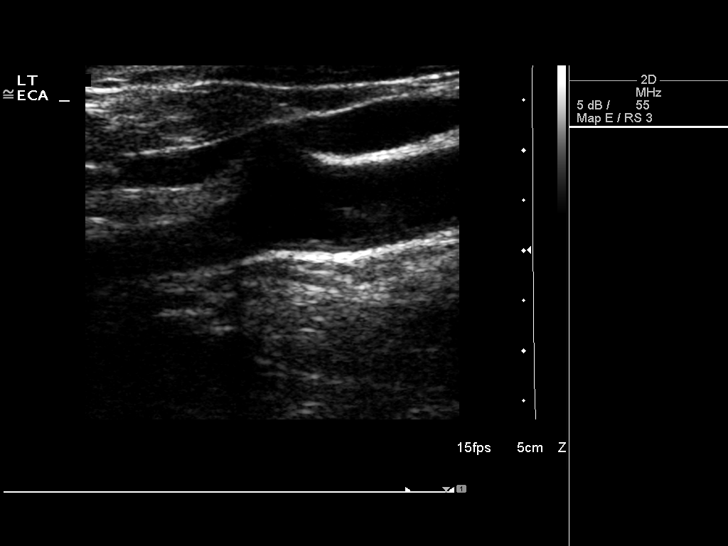
[im 56/68]
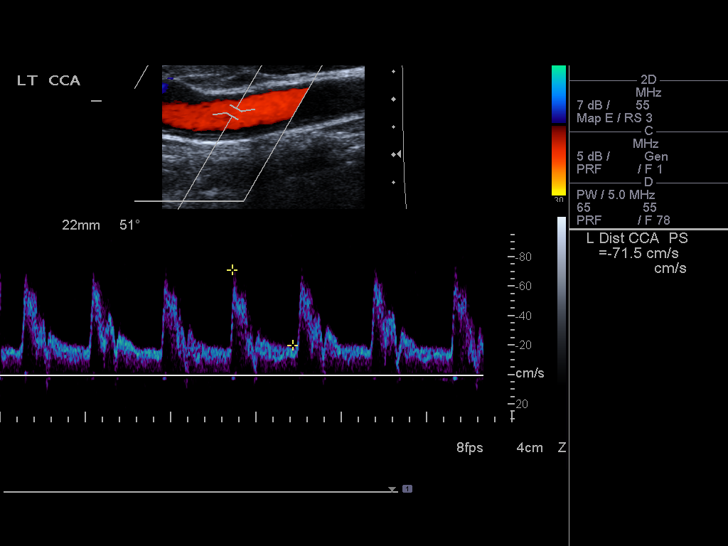
[im 62/68]
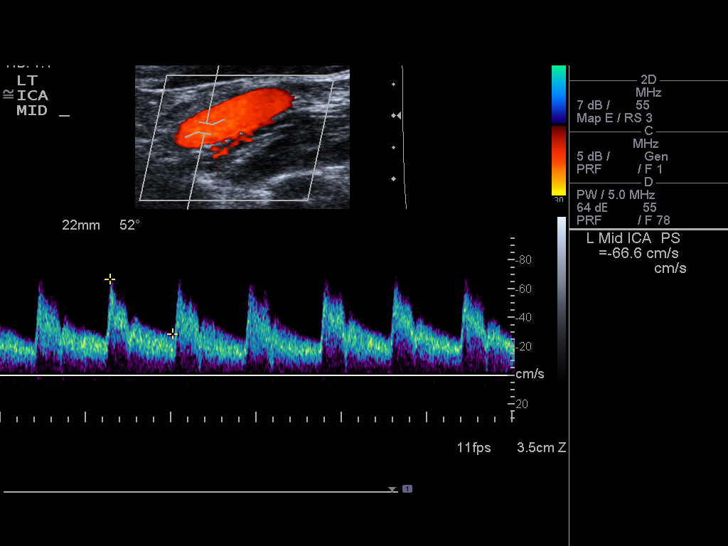
[im 68/68]
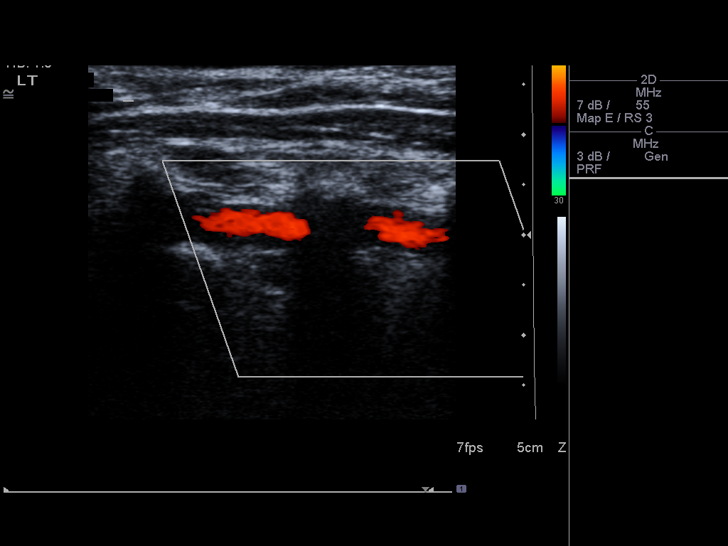

[13 of 24 positions shown; findings below may reference images not displayed]

Criteria:  Quantification of carotid stenosis is based on velocity
parameters that correlate the residual internal carotid diameter
with NASCET-based stenosis levels, using the diameter of the distal
internal carotid lumen as the denominator for stenosis measurement.

The following velocity measurements were obtained:

                 PEAK SYSTOLIC/END DIASTOLIC
RIGHT
ICA:                        68/27cm/sec
CCA:                        106/18cm/sec
SYSTOLIC ICA/CCA RATIO:
DIASTOLIC ICA/CCA RATIO:
ECA:                        171cm/sec

LEFT
ICA:                        104/43cm/sec
CCA:                        81/20cm/sec
SYSTOLIC ICA/CCA RATIO:
DIASTOLIC ICA/CCA RATIO:
ECA:                        162cm/sec
FINDINGS: RIGHT CAROTID ARTERY: Mild plaque formation throughout the common
carotid artery, carotid bulb and proximal ICA.  No significant
stenosis, less than 50% based on waveforms, velocities, and gray
scale imaging.

RIGHT VERTEBRAL ARTERY:  Antegrade flow.

LEFT CAROTID ARTERY: Mild plaque formation in the common carotid
artery, carotid bulb and proximal ICA.  Less than 50% stenosis
based on velocities, ratios, and gray scale imaging.

LEFT VERTEBRAL ARTERY:  Antegrade flow.

Multiple bilateral thyroid nodules are noted.  The largest on the
right is 1.4 cm with rim calcifications.  The largest on the left
is 1.9 cm.
IMPRESSION: Mild bilateral plaque formation without hemodynamically significant
stenosis.

Bilateral thyroid nodules as described above.  These could be
further evaluated and followed with thyroid ultrasound.

## 2011-10-18 IMAGING — US US SOFT TISSUE HEAD/NECK
1 series · 13 of 25 positions shown · non-contrast
Comparison: Carotid ultrasound 06/03/2009.  CT scan of the neck
11/15/2006.

CLINICAL DATA: The thyroid nodules identified a carotid ultrasound.

THYROID ULTRASOUND
TECHNIQUE: Ultrasound examination of the thyroid gland and
adjacent soft tissues was performed.

[Series 1: us soft tissue head/neck · 0.08mm/px · 13 of 46 slices shown]
[im 1/46]
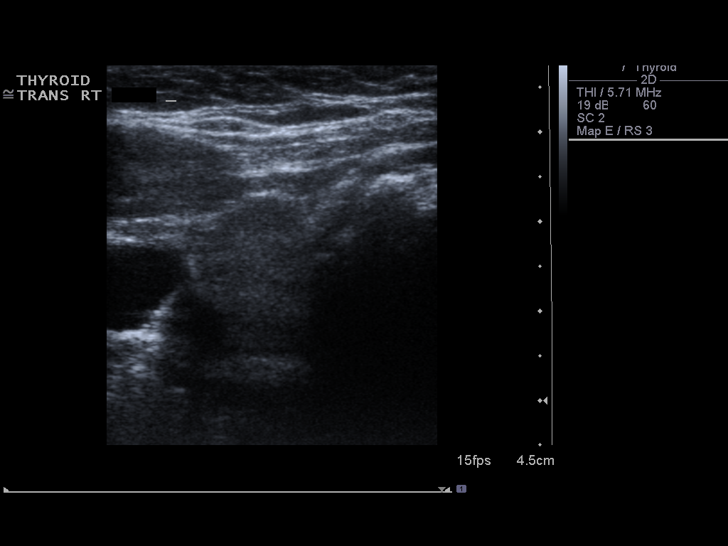
[im 4/46]
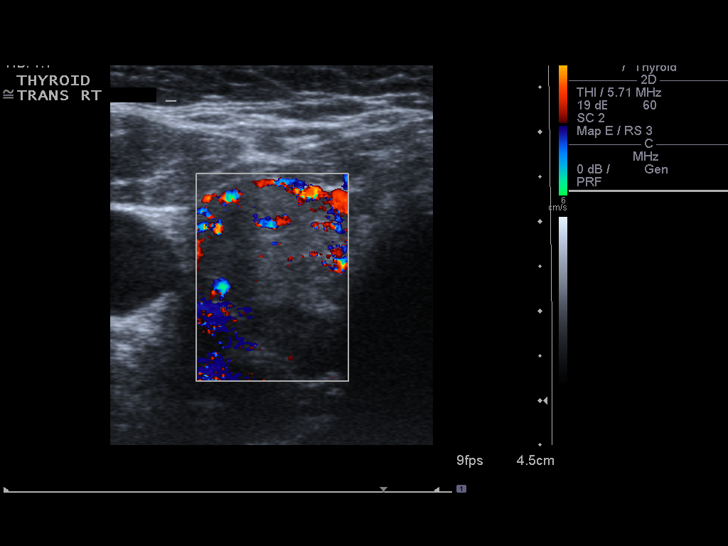
[im 8/46]
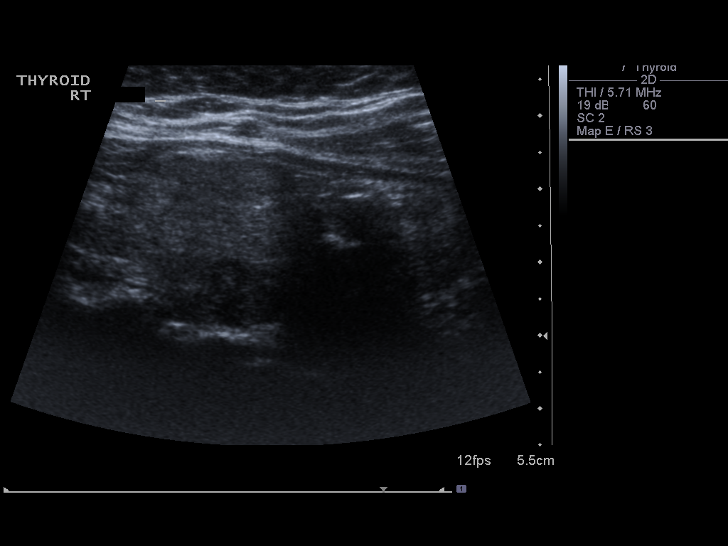
[im 12/46]
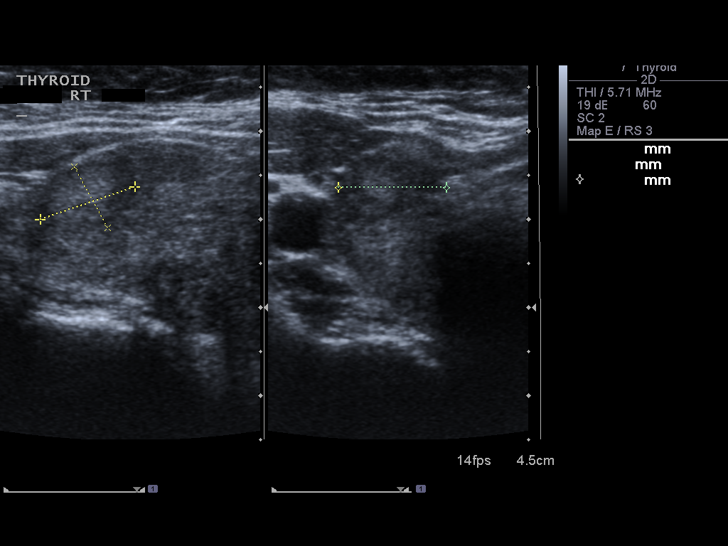
[im 16/46]
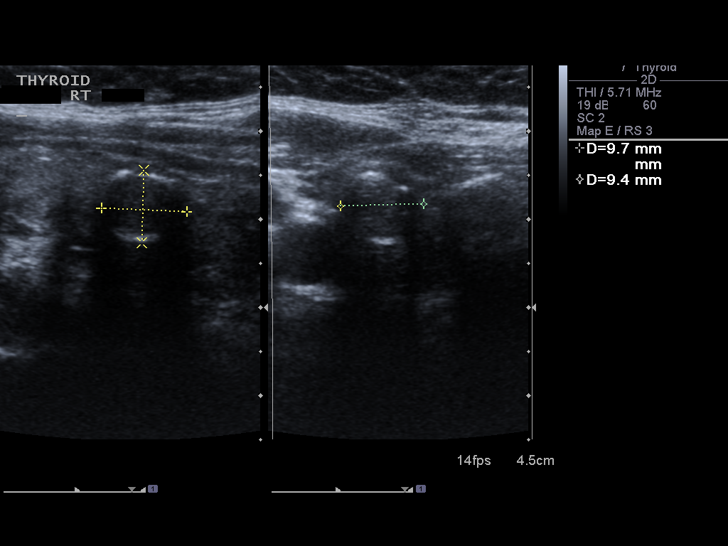
[im 19/46]
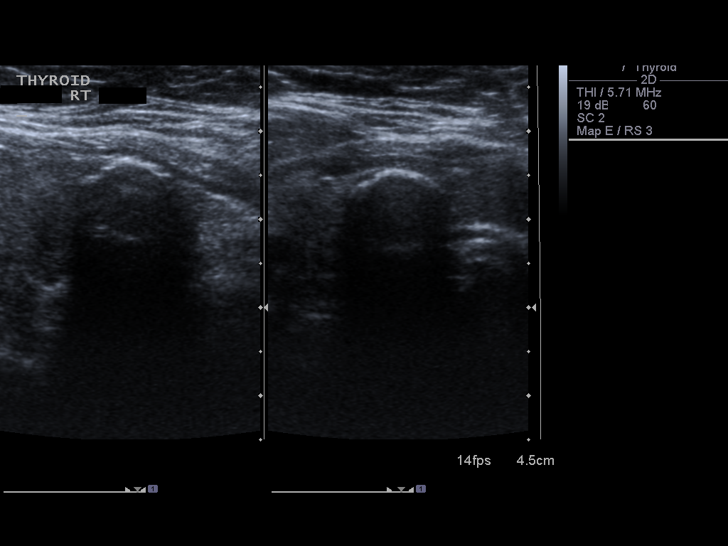
[im 23/46]
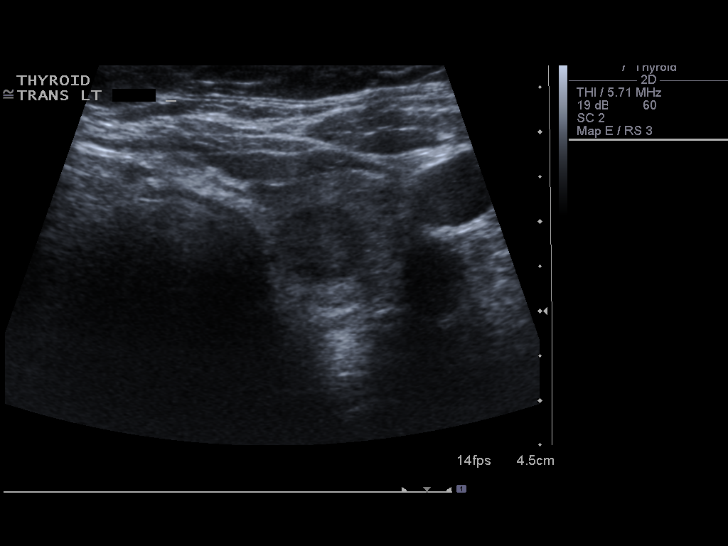
[im 27/46]
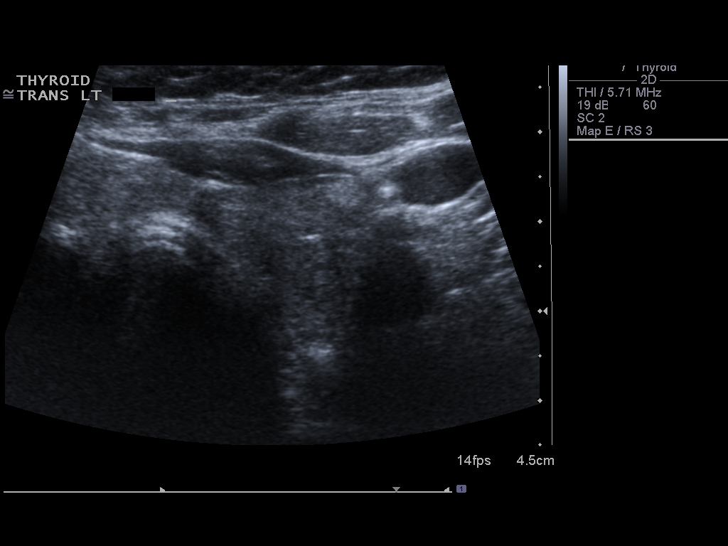
[im 31/46]
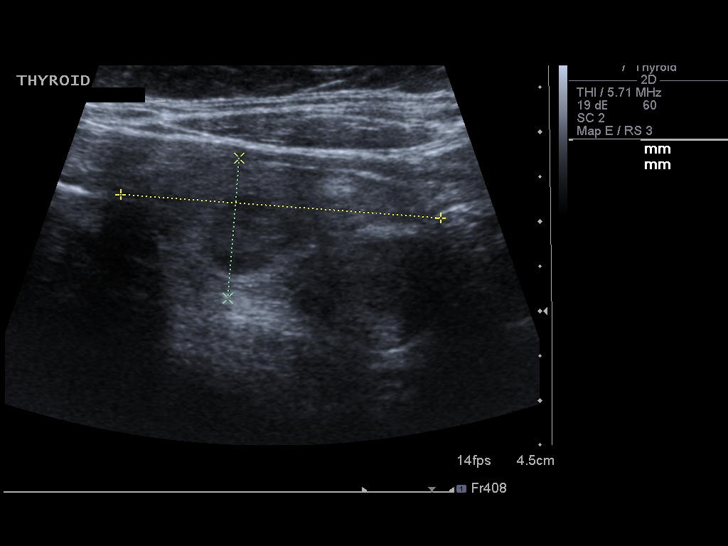
[im 34/46]
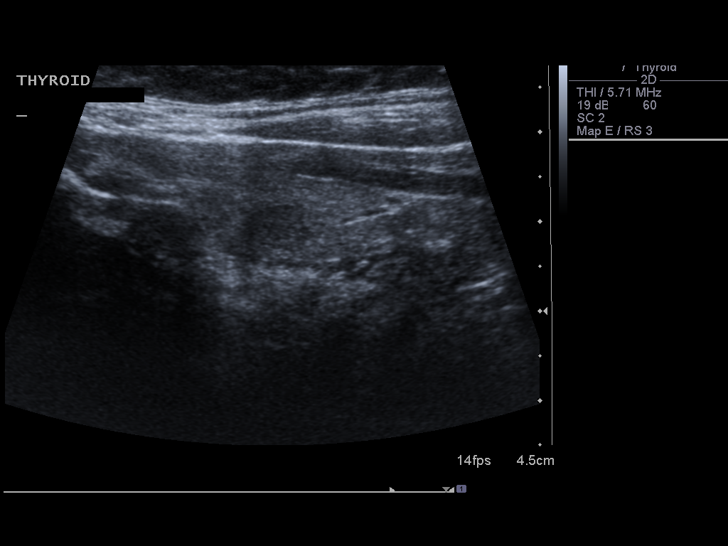
[im 38/46]
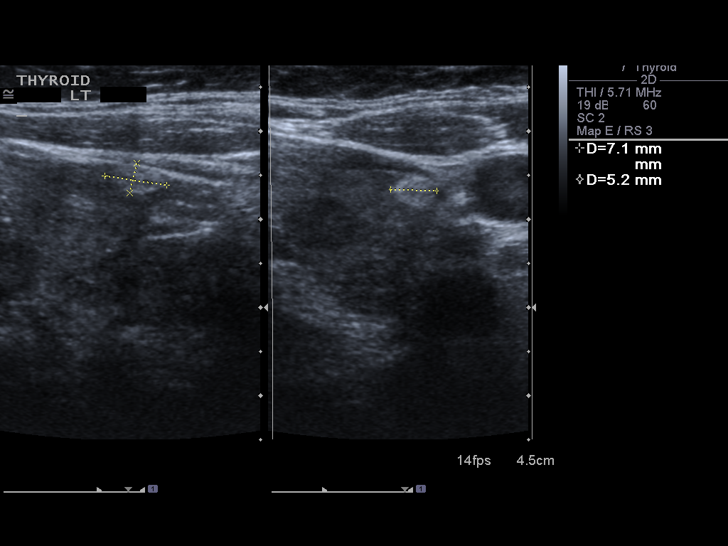
[im 42/46]
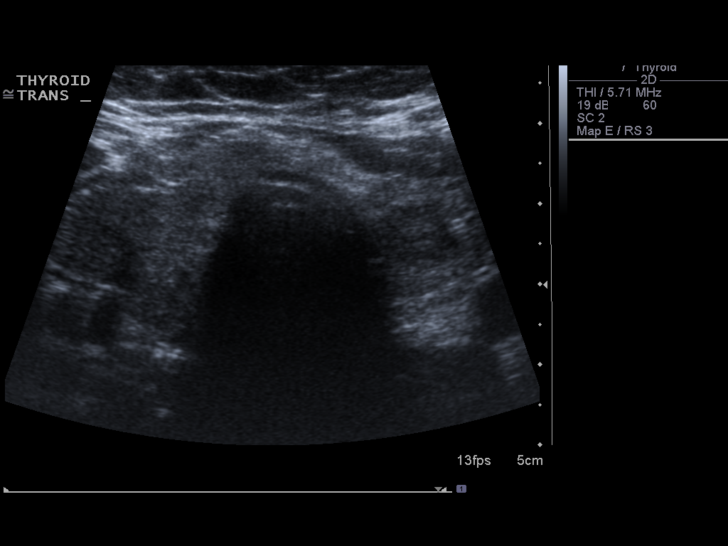
[im 46/46]
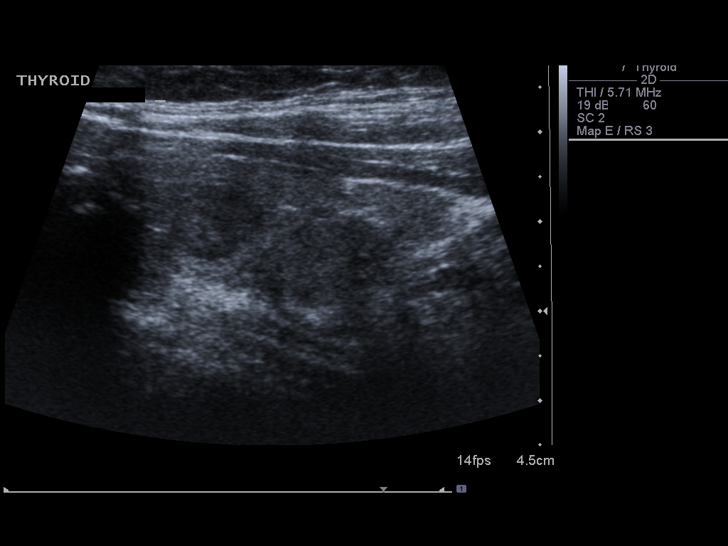

[13 of 25 positions shown; findings below may reference images not displayed]

FINDINGS: The parenchyma of the thyroid is somewhat heterogeneous.
Multiple focal lesions are present bilaterally.  The overall size
of the right lobe measures 6.3 x 2.4 x 2.3 cm.  There are two solid
benign-appearing nodules in the midportion of the right lobe
measuring 1.1 x 0.8 x 1.2 cm and 1.3 x 0.8 x 1.2 cm respectively.
Two additional nodules at the lower pole of the right lobe
demonstrate a calcific rim, measuring 1.0 x 0.8 x 0.9 cm and 1.4 x
0.9 x 1.5 cm respectively.  These appear stable compared to the
prior CT scan.

The left lobe measures 4.1 x 1.6 x 1.7 cm.  A lateral hyperechoic
lesion measures 0.7 x 0.4 x 0.5.  A second hypoechoic lesion
measures 1.1 x 1.0 x 1.1 cm.  The largest lesion is at the lower
pole of the left lobe measuring 2.1 x 1.3 x 1.2 cm.
IMPRESSION: 1.  Multinodular goiter.  No dominant nodule is present.  The
largest nodule measures 2.1 cm maximally.
2.  Recommend follow-up ultrasound in 1 year to evaluate stability
of these lesions.

## 2012-10-17 ENCOUNTER — Encounter: Payer: Self-pay | Admitting: Internal Medicine

## 2012-10-20 ENCOUNTER — Other Ambulatory Visit: Payer: Self-pay | Admitting: Gastroenterology

## 2012-10-21 ENCOUNTER — Encounter (HOSPITAL_COMMUNITY): Payer: Self-pay | Admitting: *Deleted

## 2012-10-27 ENCOUNTER — Encounter (HOSPITAL_COMMUNITY): Payer: Self-pay | Admitting: Pharmacy Technician

## 2012-11-14 ENCOUNTER — Encounter (HOSPITAL_COMMUNITY): Payer: Self-pay | Admitting: Anesthesiology

## 2012-11-14 ENCOUNTER — Encounter (HOSPITAL_COMMUNITY)
Admission: RE | Disposition: A | Discharge status: Home / Self Care | Payer: Self-pay | Source: Ambulatory Visit | Attending: Gastroenterology

## 2012-11-14 ENCOUNTER — Ambulatory Visit (HOSPITAL_COMMUNITY): Payer: Medicare Other | Admitting: Anesthesiology

## 2012-11-14 ENCOUNTER — Ambulatory Visit (HOSPITAL_COMMUNITY)
Admission: RE | Admit: 2012-11-14 | Discharge: 2012-11-14 | Disposition: A | Discharge status: Home / Self Care | Payer: Medicare Other | Source: Ambulatory Visit | Attending: Gastroenterology | Admitting: Gastroenterology

## 2012-11-14 DIAGNOSIS — E785 Hyperlipidemia, unspecified: Secondary | ICD-10-CM | POA: Insufficient documentation

## 2012-11-14 DIAGNOSIS — R1032 Left lower quadrant pain: Secondary | ICD-10-CM | POA: Insufficient documentation

## 2012-11-14 DIAGNOSIS — R195 Other fecal abnormalities: Secondary | ICD-10-CM | POA: Insufficient documentation

## 2012-11-14 DIAGNOSIS — D126 Benign neoplasm of colon, unspecified: Secondary | ICD-10-CM | POA: Insufficient documentation

## 2012-11-14 DIAGNOSIS — E119 Type 2 diabetes mellitus without complications: Secondary | ICD-10-CM | POA: Insufficient documentation

## 2012-11-14 DIAGNOSIS — K649 Unspecified hemorrhoids: Secondary | ICD-10-CM | POA: Insufficient documentation

## 2012-11-14 DIAGNOSIS — E039 Hypothyroidism, unspecified: Secondary | ICD-10-CM | POA: Insufficient documentation

## 2012-11-14 DIAGNOSIS — Z800 Family history of malignant neoplasm of digestive organs: Secondary | ICD-10-CM | POA: Insufficient documentation

## 2012-11-14 DIAGNOSIS — K5730 Diverticulosis of large intestine without perforation or abscess without bleeding: Secondary | ICD-10-CM | POA: Insufficient documentation

## 2012-11-14 DIAGNOSIS — I10 Essential (primary) hypertension: Secondary | ICD-10-CM | POA: Insufficient documentation

## 2012-11-14 HISTORY — PX: COLONOSCOPY: SHX5424

## 2012-11-14 LAB — GLUCOSE, CAPILLARY: Glucose-Capillary: 189 mg/dL — ABNORMAL HIGH (ref 70–99)

## 2012-11-14 SURGERY — COLONOSCOPY
Anesthesia: Monitor Anesthesia Care

## 2012-11-14 MED ORDER — LACTATED RINGERS IV SOLN
INTRAVENOUS | Status: DC
  Administered 2012-11-14: 1000 mL via INTRAVENOUS
  Administered 2012-11-14: 12:00:00 via INTRAVENOUS

## 2012-11-14 MED ORDER — ONDANSETRON HCL 4 MG/2ML IJ SOLN
INTRAMUSCULAR | Status: DC | PRN
  Administered 2012-11-14: 4 mg via INTRAVENOUS

## 2012-11-14 MED ORDER — FENTANYL CITRATE 0.05 MG/ML IJ SOLN
INTRAMUSCULAR | Status: DC | PRN
  Administered 2012-11-14 (×2): 50 ug via INTRAVENOUS

## 2012-11-14 MED ORDER — SODIUM CHLORIDE 0.9 % IV SOLN: INTRAVENOUS | Status: DC

## 2012-11-14 MED ORDER — PROPOFOL INFUSION 10 MG/ML OPTIME
INTRAVENOUS | Status: DC | PRN
  Administered 2012-11-14: 100 ug/kg/min via INTRAVENOUS

## 2012-11-14 MED ORDER — KETAMINE HCL 10 MG/ML IJ SOLN
INTRAMUSCULAR | Status: DC | PRN
  Administered 2012-11-14 (×2): 10 mg via INTRAVENOUS

## 2012-11-14 MED ORDER — MIDAZOLAM HCL 5 MG/5ML IJ SOLN
INTRAMUSCULAR | Status: DC | PRN
  Administered 2012-11-14 (×2): 1 mg via INTRAVENOUS

## 2012-11-14 MED ORDER — PROPOFOL 10 MG/ML IV BOLUS
INTRAVENOUS | Status: DC | PRN
  Administered 2012-11-14: 40 mg via INTRAVENOUS

## 2012-11-14 NOTE — Op Note (Signed)
[facility] [address] [place], [address]   OPERATIVE PROCEDURE REPORT  PATIENT: [name], [name]  MR#: [id number] BIRTHDATE: April 20, 1955  GENDER: Female ENDOSCOPIST: Jeani Hawking, MD ASSISTANT:   Karie Soda, technician Dwain Sarna, RN CGRN PROCEDURE DATE: 11/14/2012 PROCEDURE:   Colonoscopy with snare polypectomy ASA CLASS:   Class III INDICATIONS:Heme positive stool MEDICATIONS: MAC sedation, administered by CRNA  DESCRIPTION OF PROCEDURE:   After the risks benefits and alternatives of the procedure were thoroughly explained, informed consent was obtained.  A digital rectal exam revealed no abnormalities of the rectum.    The     endoscope was introduced through the anus  and advanced to the terminal ileum which was intubated for a short distance , No adverse events experienced. The quality of the prep was excellent. .  The instrument was then slowly withdrawn as the colon was fully examined.     FINDINGS: A 5 mm sessile transverse colon polyp was identified and removed with a cold snare.  Scattered left sided diverticula were identified.  No evidence of any masses, inflammation, ulcerations, erosions, or vascular abnormalities.          The scope was then withdrawn from the patient and the procedure terminated.  COMPLICATIONS: There were no complications.  IMPRESSION: 1) Transverse colon polyp. 2) Diverticula. 3) Hemorrhoids.  RECOMMENDATIONS: 1) Await biopsy results. 2) Repeat the colonoscopy in 5-10 years.  _______________________________ Rosalie DoctorJeani Hawking, MD 11/14/2012 12:20 PM

## 2012-11-14 NOTE — Transfer of Care (Signed)
Immediate Anesthesia Transfer of Care Note  Patient: [name]  Procedure(s) Performed: Procedure(s): COLONOSCOPY (N/A)  Patient Location: PACU  Anesthesia Type:MAC  Level of Consciousness: awake, alert  and oriented  Airway & Oxygen Therapy: Patient Spontanous Breathing and Patient connected to nasal cannula oxygen  Post-op Assessment: Report given to PACU RN and Post -op Vital signs reviewed and stable  Post vital signs: Reviewed and stable  Complications: No apparent anesthesia complications

## 2012-11-14 NOTE — H&P (Signed)
Reason for Consult: Heme positive stool Referring Physician: Warrick Parisian, M.D.  [place] HPI: This is a 57 year old fmale with a history of chronic left sided abdominal pain.  At times the pain feels as if she is being stuck with needles.  No routine testing she was identified to have heme positive stool.  She denies any overt findings of hematochezia or melena.  Intermittently she will have problems with constipation, but overall her bowels are reguiarl in their frequency.  Her maternal grandmother had colon cancer.  No prior history of a colonoscoyp.  She has asthema nd it is under control.  No issues with chest pain or prior history of an MI.  Past Medical History  Diagnosis Date  . Hypothyroidism   . Hypertension   . Diabetes mellitus   . Foot pain   . Change in vision   . Abdominal pain   . Abnormal glucose   . Acute sinusitis   . Sleep apnea   . Carotid bruit   . Dermatitis due to plants, including poison ivy, sumac, and oak   . Diverticulosis   . Fibromyalgia   . Hyperlipidemia   . Obesities, morbid   . TMJ pain dysfunction syndrome   . URI (upper respiratory infection)   . Frequency of urination and polyuria   . UTI (urinary tract infection)   . Uterine cancer   . Venous stasis   . Morbid obesity   . Chest pain     Past Surgical History  Procedure Laterality Date  . Foot surgery    . Abdominal hysterectomy    . Endoscopic vein laser treatment    . Dilation and curettage of uterus    . Abdominal hysterectomy  2008    Family History  Problem Relation Age of Onset  . Hypertension Mother   . Heart murmur Mother   . Arthritis Mother   . Hypertension Father   . Ulcers Father   . Heart attack Sister     Social History:  reports that she has never smoked. She has never used smokeless tobacco. She reports that she does not drink alcohol or use illicit drugs.  Allergies:  Allergies  Allergen Reactions  . Chlorphen-Pseudoeph-Methscop     Light headed    . Clarithromycin Itching  . Doxycycline Itching  . Penicillins     REACTION: synthetic form    Medications:  Scheduled:  Continuous: . sodium chloride    . lactated ringers 1,000 mL (11/14/12 1059)    Results for orders placed during the hospital encounter of 11/14/12 (from the past 24 hour(s))  GLUCOSE, CAPILLARY     Status: Abnormal   Collection Time    11/14/12 10:32 AM      Result Value Range   Glucose-Capillary 189 (*) 70 - 99 mg/dL     No results found.  ROS:  As stated above in the HPI otherwise negative.  Blood pressure 139/87, temperature 98.1 F (36.7 C), temperature source Oral, resp. rate 17, height 5\' 4"  (1.626 m), weight 309 lb (140.161 kg), SpO2 98.00%.    PE: Gen: NAD, Alert and Oriented HEENT:  Enigma/AT, EOMI Neck: Supple, no LAD Lungs: CTA Bilaterally CV: RRR without M/G/R ABM: Soft, NTND, +BS Ext: No C/C/E  Assessment/Plan: 1) Heme positive stool. 2) LLQ ABM pain.  Plan: 1) Colonoscopy now.  Tyrone Balash D 11/14/2012, 11:44 AM

## 2012-11-14 NOTE — Anesthesia Preprocedure Evaluation (Addendum)
Anesthesia Evaluation  Patient identified by MRN, date of birth, ID band Patient awake    Reviewed: Allergy & Precautions, H&P , NPO status , Patient's Chart, lab work & pertinent test results  Airway Mallampati: II TM Distance: >3 FB Neck ROM: Full    Dental  (+) Dental Advisory Given   Pulmonary neg pulmonary ROS, sleep apnea and Continuous Positive Airway Pressure Ventilation ,  breath sounds clear to auscultation- rhonchi        Cardiovascular hypertension, Pt. on medications Rhythm:Regular Rate:Normal     Neuro/Psych negative neurological ROS  negative psych ROS   GI/Hepatic negative GI ROS, Neg liver ROS,   Endo/Other  diabetes, Type 2, Oral Hypoglycemic AgentsHypothyroidism Morbid obesity  Renal/GU negative Renal ROS     Musculoskeletal  (+) Fibromyalgia -, narcotic dependent  Abdominal (+) + obese,   Peds  Hematology negative hematology ROS (+)   Anesthesia Other Findings   Reproductive/Obstetrics negative OB ROS                          Anesthesia Physical Anesthesia Plan  ASA: III  Anesthesia Plan: MAC   Post-op Pain Management:    Induction: Intravenous  Airway Management Planned:   Additional Equipment:   Intra-op Plan:   Post-operative Plan:   Informed Consent: I have reviewed the patients History and Physical, chart, labs and discussed the procedure including the risks, benefits and alternatives for the proposed anesthesia with the patient or authorized representative who has indicated his/her understanding and acceptance.   Dental advisory given  Plan Discussed with: CRNA  Anesthesia Plan Comments:         Anesthesia Quick Evaluation

## 2012-11-14 NOTE — Preoperative (Signed)
Beta Blockers   Reason not to administer Beta Blockers:Not Applicable 

## 2012-11-17 ENCOUNTER — Encounter (HOSPITAL_COMMUNITY): Payer: Self-pay | Admitting: Gastroenterology

## 2012-11-24 NOTE — Anesthesia Postprocedure Evaluation (Signed)
Anesthesia Post Note  Patient: [name]  Procedure(s) Performed: Procedure(s) (LRB): COLONOSCOPY (N/A)  Anesthesia type: MAC  Patient location: PACU  Post pain: Pain level controlled  Post assessment: Post-op Vital signs reviewed  Last Vitals: BP 153/78  Temp(Src) 36.7 C (Oral)  Resp 13  Ht 5\' 4"  (1.626 m)  Wt 309 lb (140.161 kg)  BMI 53.01 kg/m2  SpO2 100%  Post vital signs: Reviewed  Level of consciousness: awake  Complications: No apparent anesthesia complications

## 2012-12-09 ENCOUNTER — Ambulatory Visit: Payer: Medicare Other | Admitting: Internal Medicine

## 2013-01-27 ENCOUNTER — Other Ambulatory Visit: Payer: Self-pay | Admitting: Family Medicine

## 2013-01-27 DIAGNOSIS — Z1231 Encounter for screening mammogram for malignant neoplasm of breast: Secondary | ICD-10-CM

## 2013-02-20 ENCOUNTER — Ambulatory Visit
Admission: RE | Admit: 2013-02-20 | Discharge: 2013-02-20 | Disposition: A | Discharge status: Home / Self Care | Payer: Medicare Other | Source: Ambulatory Visit | Attending: Family Medicine | Admitting: Family Medicine

## 2013-02-20 DIAGNOSIS — Z1231 Encounter for screening mammogram for malignant neoplasm of breast: Secondary | ICD-10-CM

## 2013-11-19 ENCOUNTER — Other Ambulatory Visit: Payer: Self-pay | Admitting: Family Medicine

## 2013-11-19 ENCOUNTER — Ambulatory Visit
Admission: RE | Admit: 2013-11-19 | Discharge: 2013-11-19 | Disposition: A | Discharge status: Home / Self Care | Payer: Medicare Other | Source: Ambulatory Visit | Attending: Family Medicine | Admitting: Family Medicine

## 2013-11-19 DIAGNOSIS — N390 Urinary tract infection, site not specified: Secondary | ICD-10-CM

## 2015-06-11 DEATH — deceased

## 2016-03-29 IMAGING — CR DG PELVIS 1-2V
1 series · 1 of 1 positions shown · non-contrast
Comparison: CT abdomen and pelvis 05/06/2009.

CLINICAL DATA: Left hip pain.

EXAM:
PELVIS - 1-2 VIEW

[view not recorded]
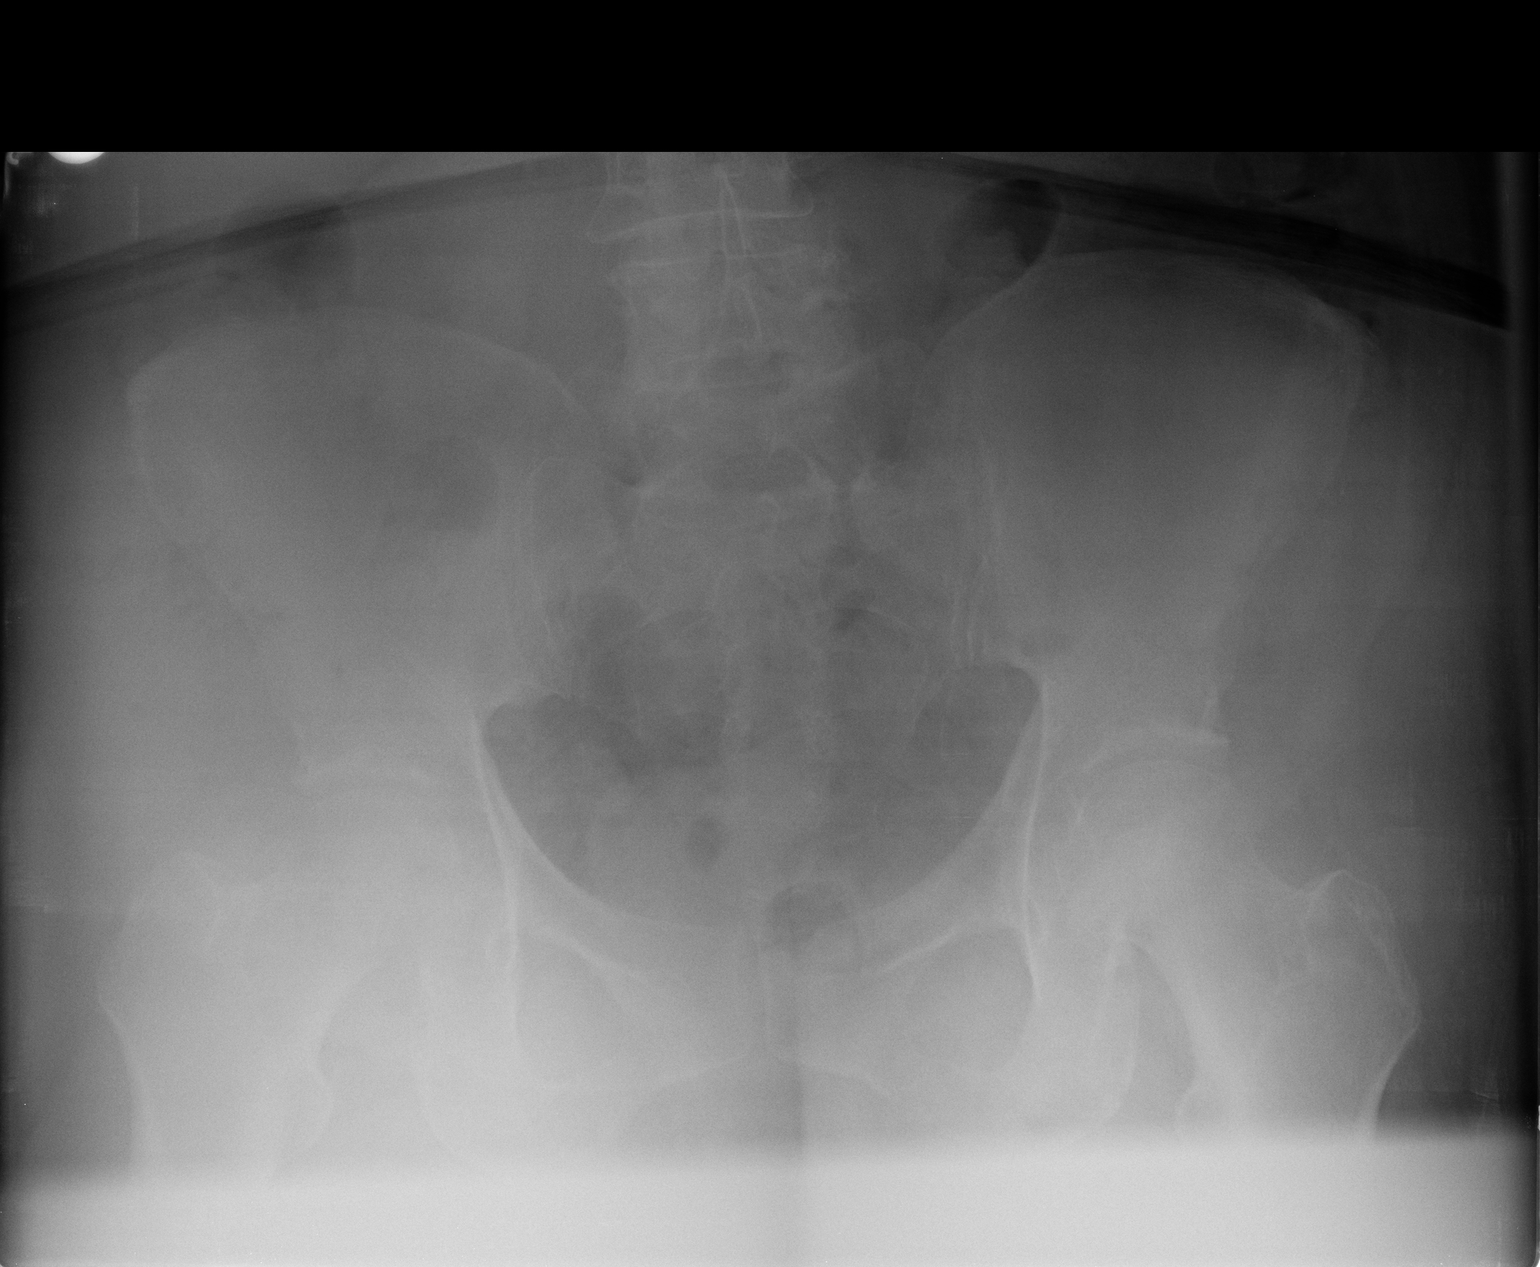

[1 of 1 positions shown; findings below may reference images not displayed]

FINDINGS: The study is limited by portable technique and the patient's size.
No fracture or dislocation is identified. No notable degenerative
change is seen. Soft tissue structures are unremarkable.
IMPRESSION: Negative examination.
